# Patient Record
Sex: Female | Born: 1990
Health system: Southern US, Community
[De-identification: ages and names within clinical notes are randomized; demographics above are authoritative.]

---

## 2013-06-24 DIAGNOSIS — F1111 Opioid abuse, in remission: Secondary | ICD-10-CM | POA: Insufficient documentation

## 2015-01-12 ENCOUNTER — Telehealth: Payer: Self-pay | Admitting: *Deleted

## 2015-01-12 NOTE — Telephone Encounter (Signed)
Called patient and left voice mail to call the clinic regarding referral for Hep C. She will need an appt for labs and once this is done she will be scheduled to see Dr. Luciana Axeomer in July. Wendall MolaJacqueline Bjorn Hallas

## 2015-02-07 ENCOUNTER — Other Ambulatory Visit: Payer: Self-pay

## 2015-02-08 ENCOUNTER — Other Ambulatory Visit: Payer: Self-pay

## 2015-02-20 ENCOUNTER — Other Ambulatory Visit: Payer: Medicaid Other

## 2015-02-20 DIAGNOSIS — B182 Chronic viral hepatitis C: Secondary | ICD-10-CM

## 2015-02-20 LAB — COMPREHENSIVE METABOLIC PANEL
ALK PHOS: 47 U/L (ref 39–117)
ALT: 94 U/L — AB (ref 0–35)
AST: 62 U/L — ABNORMAL HIGH (ref 0–37)
Albumin: 4.3 g/dL (ref 3.5–5.2)
BUN: 14 mg/dL (ref 6–23)
CALCIUM: 9.1 mg/dL (ref 8.4–10.5)
CO2: 23 mEq/L (ref 19–32)
Chloride: 104 mEq/L (ref 96–112)
Creat: 0.63 mg/dL (ref 0.50–1.10)
Glucose, Bld: 117 mg/dL — ABNORMAL HIGH (ref 70–99)
POTASSIUM: 4.2 meq/L (ref 3.5–5.3)
SODIUM: 140 meq/L (ref 135–145)
Total Bilirubin: 0.5 mg/dL (ref 0.2–1.2)
Total Protein: 6.7 g/dL (ref 6.0–8.3)

## 2015-02-20 LAB — CBC WITH DIFFERENTIAL/PLATELET
Basophils Absolute: 0 10*3/uL (ref 0.0–0.1)
Basophils Relative: 0 % (ref 0–1)
EOS ABS: 0.1 10*3/uL (ref 0.0–0.7)
EOS PCT: 1 % (ref 0–5)
HEMATOCRIT: 42.1 % (ref 36.0–46.0)
Hemoglobin: 14 g/dL (ref 12.0–15.0)
LYMPHS PCT: 33 % (ref 12–46)
Lymphs Abs: 3.1 10*3/uL (ref 0.7–4.0)
MCH: 29.4 pg (ref 26.0–34.0)
MCHC: 33.3 g/dL (ref 30.0–36.0)
MCV: 88.3 fL (ref 78.0–100.0)
MONOS PCT: 6 % (ref 3–12)
MPV: 9.7 fL (ref 8.6–12.4)
Monocytes Absolute: 0.6 10*3/uL (ref 0.1–1.0)
NEUTROS PCT: 60 % (ref 43–77)
Neutro Abs: 5.7 10*3/uL (ref 1.7–7.7)
Platelets: 306 10*3/uL (ref 150–400)
RBC: 4.77 MIL/uL (ref 3.87–5.11)
RDW: 13.4 % (ref 11.5–15.5)
WBC: 9.5 10*3/uL (ref 4.0–10.5)

## 2015-02-20 LAB — IRON: Iron: 93 ug/dL (ref 42–145)

## 2015-02-21 LAB — ANA: ANA: NEGATIVE

## 2015-02-21 LAB — HEPATITIS B CORE ANTIBODY, TOTAL: HEP B C TOTAL AB: NONREACTIVE

## 2015-02-21 LAB — HEPATITIS B SURFACE ANTIBODY,QUALITATIVE: HEP B S AB: POSITIVE — AB

## 2015-02-21 LAB — HIV ANTIBODY (ROUTINE TESTING W REFLEX): HIV 1&2 Ab, 4th Generation: NONREACTIVE

## 2015-02-21 LAB — PROTIME-INR
INR: 0.95 (ref <1.50)
PROTHROMBIN TIME: 12.7 s (ref 11.6–15.2)

## 2015-02-21 LAB — HEPATITIS B SURFACE ANTIGEN: Hepatitis B Surface Ag: NEGATIVE

## 2015-02-21 LAB — HEPATITIS A ANTIBODY, TOTAL: Hep A Total Ab: REACTIVE — AB

## 2015-02-22 LAB — HEPATITIS C RNA QUANTITATIVE
HCV Quantitative Log: 5.19 {Log} — ABNORMAL HIGH (ref <1.18)
HCV Quantitative: 153133 IU/mL — ABNORMAL HIGH (ref <15)

## 2015-02-24 LAB — HEPATITIS C GENOTYPE

## 2015-03-21 ENCOUNTER — Ambulatory Visit (INDEPENDENT_AMBULATORY_CARE_PROVIDER_SITE_OTHER): Payer: Medicaid Other | Admitting: Internal Medicine

## 2015-03-21 ENCOUNTER — Encounter: Payer: Self-pay | Admitting: Internal Medicine

## 2015-03-21 VITALS — BP 131/86 | HR 76 | Temp 98.1°F | Ht 69.0 in | Wt 165.0 lb

## 2015-03-21 DIAGNOSIS — F1910 Other psychoactive substance abuse, uncomplicated: Secondary | ICD-10-CM | POA: Diagnosis not present

## 2015-03-21 DIAGNOSIS — B182 Chronic viral hepatitis C: Secondary | ICD-10-CM | POA: Insufficient documentation

## 2015-03-21 MED ORDER — OMBITAS-PARITAPRE-RITONA-DASAB 12.5-75-50 &250 MG PO TBPK: 1.0000 | ORAL_TABLET | Freq: Every day | ORAL | Status: DC

## 2015-03-21 MED ORDER — RIBAVIRIN 200 MG PO TABS: ORAL_TABLET | ORAL | Status: DC

## 2015-03-21 NOTE — Progress Notes (Addendum)
HPI:  +Chloe Mitchell is a 24 y.o. female who presents for initial evaluation and management of chronic hepatitis C.  Patient tested positive 1 year ago. 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 Patient denies multiple sexual partners, renal dialysis, sexual contact with person with liver disease, tattoos. Patient has had other studies performed. Results: hepatitis C RNA by PCR, result: positive. Patient has not had prior treatment for Hepatitis C. Patient does not have a past history of liver disease. Patient does not have a family history of liver disease.  She used heroin only a few times and then went into a drug rehab program and successfully completed it.  She has been drug free 1 year.  She was tested 1 year ago and was hepatitis C positive then and now again and is confirmed positive and active chronic hepatitis C.      Patient does have documented immunity to Hepatitis A. Patient does have documented immunity to Hepatitis B.    Review of Systems:  Constitutional: Negative for fatigue, weight loss.  HENT: Negative for hearing loss, ear pain, neck pain, tinnitus and ear discharge.  Eyes: Negative for icterus, discharge and redness.  Respiratory: Negative fordypsnea, wheezing.  Cardiovascular: Negative for chest pain, palpitations, orthopnea, claudication and leg swelling.  Gastrointestinal: Negative for nausea, vomiting, abdominal distention and abdominal pain.Negative for  Genitourinary: Negative for dysuria, urgency, frequency, hematuria and flank pain.  Musculoskeletal: Negative for arthralgias, arthritis Skin: Negative for itching and rash. Neurological: Negative for dizziness and weakness. Endo/Heme/Allergies: Negative for environmental allergies and polydipsia. Does not bruise/bleed easily.      No past medical history on file.  Prior to Admission medications   Medication Sig Start Date End Date Taking?  Authorizing Provider  sertraline (ZOLOFT) 50 MG tablet Take 50 mg by mouth daily.   Yes Historical Provider, MD  Ombitas-Paritapre-Ritona-Dasab (VIEKIRA PAK) 12.5-75-50 &250 MG TBPK Take 1 packet by mouth daily. 03/21/15   Gardiner Barefoot, MD  ribavirin (COPEGUS) 200 MG tablet 600 mg in am and 600 mg in pm 03/21/15   Gardiner Barefoot, MD    No Known Allergies  Social History  Substance Use Topics  . Smoking status: Never Smoker   . Smokeless tobacco: Never Used  . Alcohol Use: No    FH: no history of liver disease, no liver cancer   Objective:   Filed Vitals:   03/21/15 1551  BP: 131/86  Pulse: 76  Temp: 98.1 F (36.7 C)   GEN: in no apparent distress and alert HEENT: anicteric Cardiac: Cor RRR and No murmurs Lungs: clear Abdomen: Bowel sounds are normal, liver is not enlarged, spleen is not enlarged Ext: peripheral pulses normal, no pedal edema, no clubbing or cyanosis Skin: negative for - jaundice, spider hemangioma, telangiectasia, palmar erythema, ecchymosis and atrophy Musculoskeletal: no joint swelling  Laboratory Genotype:  Lab Results  Component Value Date   HCVGENOTYPE 1a 02/20/2015   HCV viral load:  Lab Results  Component Value Date   HCVQUANT 161096* 02/20/2015   Lab Results  Component Value Date   WBC 9.5 02/20/2015   HGB 14.0 02/20/2015   HCT 42.1 02/20/2015   MCV 88.3 02/20/2015   PLT 306 02/20/2015    Lab Results  Component Value Date   CREATININE 0.63 02/20/2015   BUN 14 02/20/2015   NA 140 02/20/2015   K 4.2 02/20/2015   CL 104 02/20/2015   CO2 23  02/20/2015    Lab Results  Component Value Date   ALT 94* 02/20/2015   AST 62* 02/20/2015   ALKPHOS 47 02/20/2015   BILITOT 0.5 02/20/2015   INR 0.95 02/20/2015      Assessment: Chronic Hepatitis C genotype 1a I discussed with the patient the natural history and progression of chronic hepatitis C infection including about 30% of people who develop cirrhosis of the liver and once  cirrhosis is established there is a 2-7% risk per year of liver cancer and liver failure.    Plan: 1) Patient counseled extensively on limiting acetaminophen to no more than 2 grams daily, avoidance of alcohol. 2) Transmission discussed with patient including sexual transmission, sharing razors and toothbrush.   3) Will need referral to gastroenterology if concern for cirrhosis 4) Will need referral for substance abuse counseling: No. 5) Will prescribe Harvoni or Delmar Landau for 12 weeks once work up complete 6) Hepatitis A vaccine No. 7) Hepatitis B vaccine No. 8) Pneumovax vaccine if concern for cirrhosis 9) will follow up after elastography

## 2015-03-21 NOTE — Patient Instructions (Signed)
Date 03/21/2015 Dear Ms Randa Evens, As discussed in the ID Clinic, your hepatitis C therapy will include the following medications:                                       VIEKIRA PAK          Please note that ALL MEDICATIONS WILL START ON THE SAME DATE for a total of 12 weeks. ---------------------------------------------------------------- Your HCV Treatment Start Date: TBA   Your HCV genotype:  1a    Liver Fibrosis: TBD    ---------------------------------------------------------------- YOUR PHARMACY CONTACT:   [facility] [address] Phone: [phone] Hours: Monday to Friday 7:30 am to 6:00 pm   Please always contact your pharmacy at least 3-4 business days before you run out of medications to ensure your next month's medication is ready or 1 week prior to running out if you receive it by mail.  Remember, each prescription is for 28 days. ---------------------------------------------------------------- GENERAL NOTES REGARDING YOUR HEPATITIS C MEDICATION:  VIEKIRA PAK contains 2 different types of tablets. You must take both types of tablets exactly as prescribed, to treat your chronic hepatitis C virus (HCV) infection. . the pink tablet contains: the medicines ombitasvir, paritaprevir, and ritonavir . the beige tablet contains: the medicine dasabuvir  How should I take VIEKIRA PAK? Marland Kitchen When you receive your VIEKIRA PAK prescription, you will get a monthly carton that contains enough medicine for 28 days. . Each monthly carton of VIEKIRA PAK contains 4 smaller cartons.  . Each of the 4 smaller cartons contains enough child resistant daily dose packs of medicine to last for 7 days (1 week). . Each daily dose pack contains all of your VIEKIRA PAK medicine for 1 day (4 tablets). Follow the instructions on each daily dose pack about how to remove the tablets. . Take VIEKIRA PAK tablets with a meal as follows: ?  take the 2 pink tablets (ombitasvir, paritaprevir, and ritonavir), with 1 of the beige tablets (dasabuvir), at about the same time every morning. ? take the second beige tablet (dasabuvir), at about the same time every evening. . If you miss a dose of the pink tablets, and it is less than 12 hours from the time you usually take your dose, take the missed dose with a meal as soon as possible. Then take your next dose at your usual time with a meal. . If you miss a dose of the pink tablets, and it is more than 12 hours from the time you usually take your dose, do not take the missed dose. Take your next dose at your usual time with a meal. . If you miss a dose of the beige tablet, and it is less than 6 hours from the time you usually take your dose, take the missed dose with a meal as soon as possible. Then take your next dose at your usual time with a meal. . If you miss a dose of the beige tablet, and it is more than 6 hours from the time you usually take your dose, do not take the missed dose. Take your next dose at your usual time with a meal. . Do not take more than your prescribed dose of VIEKIRA PAK to make up for a missed dose. . If you take too much VIEKIRA PAK, cal  Ribavirin is an additional medication  required and is taken twice a day. -3 capsules in the morning -2 or 3 capsules in the evening  Common side effects: 1. Nausea 2. Itching 3. Insomnia 4. Fatigue  Do not take with the following drugs: alfuzosin HCL; colchicine; carbamazepine, phenytoin, phenobarbital; gemfibrozil; rifampin; ergotamine, dihydroergotamine, ergonovine, methylergonovine; ethinyl estradiol-containing medicines, such as combined oral contraceptives; St. John's Wort (Hypericum perforatum); lovastatin, simvastatin; pimozide; efavirenz; sildenafil (when dosed as Revatio* for pulmonary arterial hypertension); triazolam and oral midazolam.  Call [phone] ([phone]) for financial assistance in  certain cases. ---------------------------------------------------------------- GENERAL HELPFUL HINTS ON HCV THERAPY: 1. No alcohol. 2. Protect against sun-sensitivity/sunburns (wear sunglasses, hat, long sleeves, pants and sunscreen). 3. Stay well-hydrated/well-moisturized. 4. Notify the ID Clinic of any changes in your other over-the-counter/herbal or prescription medications. 5. If you miss a dose of your medication, take the missed dose as soon as you remember. Return to your regular time/dose schedule the next day.  6.  Do not stop taking your medications without first talking with your healthcare provider. 7.  You may take Tylenol (acetaminophen), as long as the dose is less than 2000 mg (OR no more than 4 tablets of the Tylenol Extra Strengths 500mg  tablet) in 24 hours. 8.  You will need to obtain routine labs and/or office visits at [facility] at weeks 2, 4, 8 and 12 as well as 12 weeks after completion of treatment.   Staci Righter, MD  [facility] [address] [place] [address] [place], [place]  [address] [phone]

## 2015-03-22 ENCOUNTER — Telehealth: Payer: Self-pay | Admitting: *Deleted

## 2015-03-22 NOTE — Telephone Encounter (Signed)
Office note faxed to referring provider, Dr. Lerry Liner at [phone]. Chloe Mitchell

## 2015-03-26 ENCOUNTER — Telehealth: Payer: Self-pay | Admitting: *Deleted

## 2015-03-26 ENCOUNTER — Telehealth: Payer: Self-pay

## 2015-03-26 NOTE — Telephone Encounter (Signed)
Patient's medicaid is working.  [employer] submitted prior authorization request for elastography.  Will schedule once this is approved. Andree Coss, RN

## 2015-03-26 NOTE — Telephone Encounter (Signed)
[employer] requesting copies of office visit and all labs done for patient related to Hep C. Treatment.  Notes and labs printed and faxed to  [employer] [phone].  Copy of document request sent for scanning.   Laurell Josephs, RN

## 2015-04-02 ENCOUNTER — Telehealth: Payer: Self-pay | Admitting: *Deleted

## 2015-04-02 NOTE — Telephone Encounter (Signed)
Called patient and notified her of appt for elastography on 04/11/15 at 6:45 AM. Nothing to eat or drink after midnight. Chloe Mitchell

## 2015-04-03 NOTE — Telephone Encounter (Signed)
Elastography scheduled and patient informed of appt date and time. Chloe Mitchell

## 2015-04-10 ENCOUNTER — Telehealth (HOSPITAL_COMMUNITY): Payer: Self-pay

## 2015-04-10 NOTE — Telephone Encounter (Signed)
Called to remind pt of 7am appt in radiology on 04/11/15, pt agreed to stay npo in prep for exam. AW

## 2015-04-11 ENCOUNTER — Ambulatory Visit (HOSPITAL_COMMUNITY)
Admission: RE | Admit: 2015-04-11 | Discharge: 2015-04-11 | Disposition: A | Discharge status: Home / Self Care | Payer: Medicaid Other | Source: Ambulatory Visit | Attending: Internal Medicine | Admitting: Internal Medicine

## 2015-04-11 DIAGNOSIS — K760 Fatty (change of) liver, not elsewhere classified: Secondary | ICD-10-CM | POA: Diagnosis not present

## 2015-04-11 DIAGNOSIS — R938 Abnormal findings on diagnostic imaging of other specified body structures: Secondary | ICD-10-CM | POA: Diagnosis not present

## 2015-04-11 DIAGNOSIS — B182 Chronic viral hepatitis C: Secondary | ICD-10-CM

## 2015-04-11 DIAGNOSIS — B1920 Unspecified viral hepatitis C without hepatic coma: Secondary | ICD-10-CM | POA: Diagnosis not present

## 2015-04-20 ENCOUNTER — Encounter (HOSPITAL_COMMUNITY): Payer: Self-pay | Admitting: Pharmacy Technician

## 2015-05-16 ENCOUNTER — Ambulatory Visit: Payer: Medicaid Other

## 2015-05-23 ENCOUNTER — Other Ambulatory Visit: Payer: Medicaid Other

## 2015-05-23 ENCOUNTER — Ambulatory Visit: Payer: Medicaid Other | Admitting: Pharmacist Clinician (PhC)/ Clinical Pharmacy Specialist

## 2015-05-23 DIAGNOSIS — B182 Chronic viral hepatitis C: Secondary | ICD-10-CM

## 2015-05-23 LAB — CBC
HCT: 37.9 % (ref 36.0–46.0)
Hemoglobin: 12.7 g/dL (ref 12.0–15.0)
MCH: 29 pg (ref 26.0–34.0)
MCHC: 33.5 g/dL (ref 30.0–36.0)
MCV: 86.5 fL (ref 78.0–100.0)
MPV: 9.5 fL (ref 8.6–12.4)
PLATELETS: 293 10*3/uL (ref 150–400)
RBC: 4.38 MIL/uL (ref 3.87–5.11)
RDW: 13.3 % (ref 11.5–15.5)
WBC: 9.8 10*3/uL (ref 4.0–10.5)

## 2015-05-23 NOTE — Progress Notes (Signed)
Patient ID: Chloe Mitchell, female   DOB: 04/13/1991, 24 y.o.   MRN: [id number] HPI: Chloe Mitchell is a 24 y.o. female who is here for her 3 wks hep C visit.   Lab Results  Component Value Date   HCVGENOTYPE 1a 02/20/2015    Allergies: No Known Allergies  Vitals:    Past Medical History: No past medical history on file.  Social History: Social History   Social History  . Marital Status: Single    Spouse Name: N/A  . Number of Children: N/A  . Years of Education: N/A   Social History Main Topics  . Smoking status: Never Smoker   . Smokeless tobacco: Never Used  . Alcohol Use: No  . Drug Use: No     Comment: per patient clean for 2 years 2014  . Sexual Activity: No   Other Topics Concern  . Not on file   Social History Narrative  . No narrative on file    Labs: HEP B S AB (no units)  Date Value  02/20/2015 POS*   HEPATITIS B SURFACE AG (no units)  Date Value  02/20/2015 NEGATIVE    Lab Results  Component Value Date   HCVGENOTYPE 1a 02/20/2015    Hepatitis C RNA quantitative Latest Ref Rng 02/20/2015  HCV Quantitative <15 IU/mL 153133(H)  HCV Quantitative Log <1.18 log 10 5.19(H)    AST (U/L)  Date Value  02/20/2015 62*   ALT (U/L)  Date Value  02/20/2015 94*   INR (no units)  Date Value  02/20/2015 0.95    CrCl: CrCl cannot be calculated (Unknown ideal weight.).  Fibrosis Score: F2/3 as assessed by [facility]  Child-Pugh Score: Class A  Previous Treatment Regimen: Naive  Assessment: 24 yo who is here for her 3 weeks visit. She started viekira/riba around 9/25. She has done well on it. She did tell me that she did missed a dose of Viekira and not the ribavirin. She then double up the dose the next day. I had to adamantly tell to never do that again and to set a phone reminder for her daily meds. We are going to get a CBC today to eval for anemia. Otherwise, she has tolerated very well. She is going to come back in a week or so  for the VL to get the extension. I also set her an appt with Dr. Luciana Axeomer in December.   Recommendations:  Cont Viekira and ribavirin CBC today VL in 2 week  Ulyses [place]ward[place], Almando Brawley WestmorelandQuang, VermontPharm.D., BCPS, AAHIVP Clinical Infectious Disease Pharmacist [facility] 05/23/2015, 4:12 PM

## 2015-05-24 ENCOUNTER — Other Ambulatory Visit: Payer: Medicaid Other

## 2015-06-06 ENCOUNTER — Telehealth: Payer: Self-pay | Admitting: Pharmacy Technician

## 2015-06-06 ENCOUNTER — Other Ambulatory Visit: Payer: Medicaid Other

## 2015-06-13 ENCOUNTER — Other Ambulatory Visit: Payer: Medicaid Other

## 2015-06-13 DIAGNOSIS — B182 Chronic viral hepatitis C: Secondary | ICD-10-CM

## 2015-06-13 LAB — CBC
HCT: 38.8 % (ref 36.0–46.0)
Hemoglobin: 12.2 g/dL (ref 12.0–15.0)
MCH: 28 pg (ref 26.0–34.0)
MCHC: 31.4 g/dL (ref 30.0–36.0)
MCV: 89.2 fL (ref 78.0–100.0)
MPV: 9.4 fL (ref 8.6–12.4)
Platelets: 346 10*3/uL (ref 150–400)
RBC: 4.35 MIL/uL (ref 3.87–5.11)
RDW: 13.7 % (ref 11.5–15.5)
WBC: 11.5 10*3/uL — AB (ref 4.0–10.5)

## 2015-06-18 ENCOUNTER — Other Ambulatory Visit: Payer: Medicaid Other

## 2015-06-18 ENCOUNTER — Other Ambulatory Visit: Payer: Self-pay | Admitting: Internal Medicine

## 2015-06-18 DIAGNOSIS — B182 Chronic viral hepatitis C: Secondary | ICD-10-CM

## 2015-06-20 LAB — HEPATITIS C RNA QUANTITATIVE: HCV Quantitative: NOT DETECTED IU/mL (ref <15)

## 2015-07-16 ENCOUNTER — Other Ambulatory Visit: Payer: Self-pay | Admitting: Pharmacist Clinician (PhC)/ Clinical Pharmacy Specialist

## 2015-07-16 MED ORDER — RIBAVIRIN 200 MG PO TABS: ORAL_TABLET | ORAL | Status: DC

## 2015-07-26 ENCOUNTER — Ambulatory Visit (INDEPENDENT_AMBULATORY_CARE_PROVIDER_SITE_OTHER): Payer: Medicaid Other | Admitting: Internal Medicine

## 2015-07-26 ENCOUNTER — Encounter: Payer: Self-pay | Admitting: Internal Medicine

## 2015-07-26 VITALS — BP 154/82 | HR 85 | Temp 97.7°F | Wt 167.0 lb

## 2015-07-26 DIAGNOSIS — K7400 Hepatic fibrosis, unspecified: Secondary | ICD-10-CM | POA: Insufficient documentation

## 2015-07-26 DIAGNOSIS — B182 Chronic viral hepatitis C: Secondary | ICD-10-CM | POA: Diagnosis not present

## 2015-07-26 DIAGNOSIS — F1910 Other psychoactive substance abuse, uncomplicated: Secondary | ICD-10-CM

## 2015-07-26 NOTE — Progress Notes (Signed)
   Subjective:    Patient ID: Chloe DartingAnna Victoria Mitchell, female    DOB: 12/18/1990, 24 y.o.   MRN: [id number]  HPI Here for follow up of HCV.   Started [employer]ira Pak and now almost finished with about 1 week left.  Viral load in November undetectable.  No issues with the medicine except some itching.  Hgb remained stable.  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  No further alcohol, no drugs.  No fatigue.  Has remained off of drugs.  Does not drink alcohol now.     Review of Systems  Constitutional: Negative for fatigue.  Skin: Negative for rash.  Neurological: Negative for dizziness and light-headedness.       Objective:   Physical Exam  Constitutional: She appears well-developed and well-nourished. No distress.  HENT:  Mouth/Throat: No oropharyngeal exudate.  Eyes: No scleral icterus.  Cardiovascular: Normal rate, regular rhythm and normal heart sounds.   No murmur heard. Pulmonary/Chest: Effort normal and breath sounds normal. No respiratory distress.  Lymphadenopathy:    She has no cervical adenopathy.  Skin: No rash noted.    Social History   Social History  . Marital Status: Single    Spouse Name: N/A  . Number of Children: N/A  . Years of Education: N/A   Occupational History  . Not on file.   Social History Main Topics  . Smoking status: Never Smoker   . Smokeless tobacco: Never Used  . Alcohol Use: No  . Drug Use: No     Comment: per patient clean for 2 years 2014  . Sexual Activity: No   Other Topics Concern  . Not on file   Social History Narrative        Assessment & Plan:

## 2015-07-26 NOTE — Assessment & Plan Note (Signed)
Early vl undetectable.  Will recheck in 1 month and see her in 4 months for SVR12.

## 2015-07-26 NOTE — Assessment & Plan Note (Signed)
Encouraged continued abstinence and she appears very motivated.

## 2015-07-26 NOTE — Assessment & Plan Note (Signed)
Discussed results with her.  Will recheck in about 1 year.

## 2015-08-27 ENCOUNTER — Other Ambulatory Visit: Payer: Medicaid Other

## 2015-08-27 DIAGNOSIS — B182 Chronic viral hepatitis C: Secondary | ICD-10-CM

## 2015-08-27 LAB — CBC WITH DIFFERENTIAL/PLATELET
BASOS PCT: 0 % (ref 0–1)
Basophils Absolute: 0 10*3/uL (ref 0.0–0.1)
EOS ABS: 0.1 10*3/uL (ref 0.0–0.7)
Eosinophils Relative: 1 % (ref 0–5)
HCT: 42.3 % (ref 36.0–46.0)
HEMOGLOBIN: 13.5 g/dL (ref 12.0–15.0)
Lymphocytes Relative: 36 % (ref 12–46)
Lymphs Abs: 3.9 10*3/uL (ref 0.7–4.0)
MCH: 28.2 pg (ref 26.0–34.0)
MCHC: 31.9 g/dL (ref 30.0–36.0)
MCV: 88.5 fL (ref 78.0–100.0)
MONO ABS: 0.5 10*3/uL (ref 0.1–1.0)
MPV: 9.9 fL (ref 8.6–12.4)
Monocytes Relative: 5 % (ref 3–12)
NEUTROS ABS: 6.3 10*3/uL (ref 1.7–7.7)
Neutrophils Relative %: 58 % (ref 43–77)
PLATELETS: 296 10*3/uL (ref 150–400)
RBC: 4.78 MIL/uL (ref 3.87–5.11)
RDW: 13.2 % (ref 11.5–15.5)
WBC: 10.9 10*3/uL — AB (ref 4.0–10.5)

## 2015-08-28 LAB — HEPATITIS C RNA QUANTITATIVE: HCV Quantitative: NOT DETECTED IU/mL (ref <15)

## 2015-09-28 NOTE — Telephone Encounter (Signed)
Left VM for her to call me back.  She needs to come in for HCV quantitative labs to get Medicaid extension approved for final month of medication

## 2015-10-15 IMAGING — US US ABDOMEN COMPLETE W/ ELASTOGRAPHY
1 series · 12 of 16 positions shown · non-contrast
Comparison: None.

CLINICAL DATA: 24-year-old female with hepatitis-C.



[Series 1: us abdomen complete w/ elastography · 0.17mm/px · 12 of 16 slices shown]
[im 1/16]
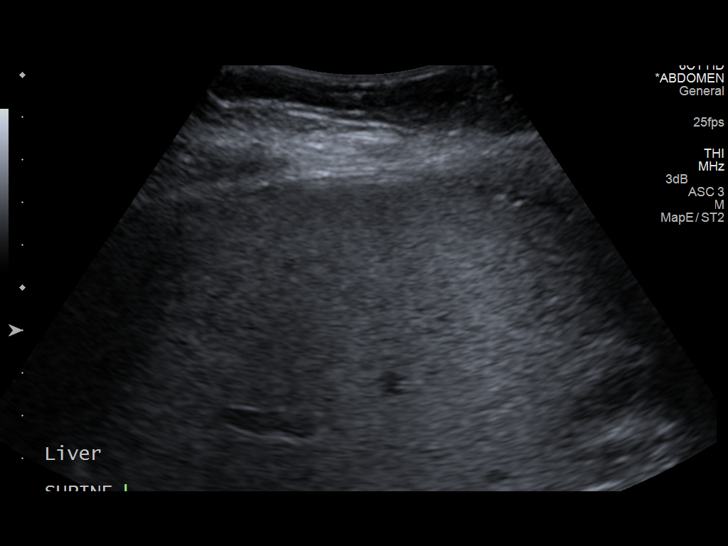
[im 3/16]
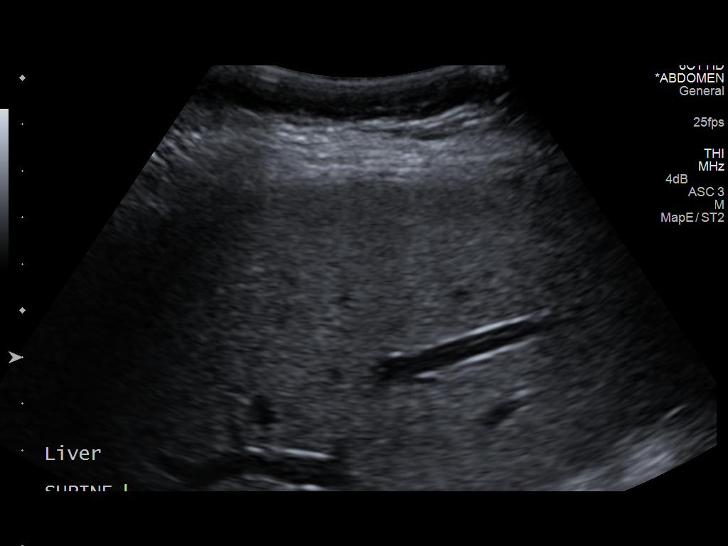
[im 4/16]
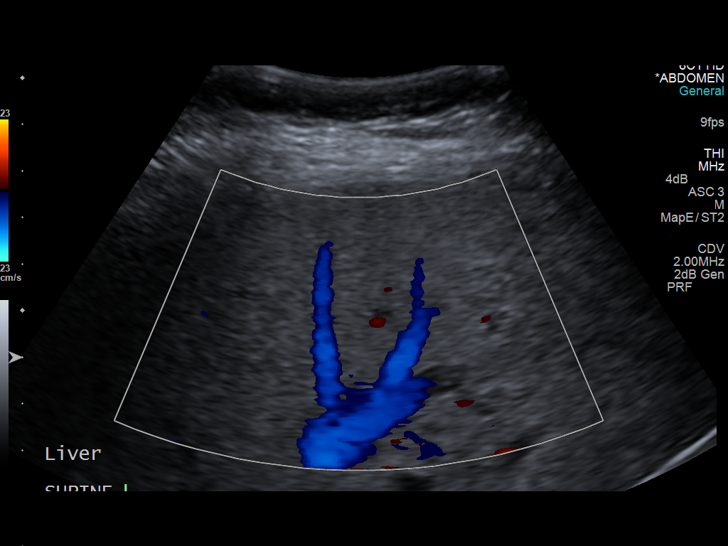
[im 5/16]
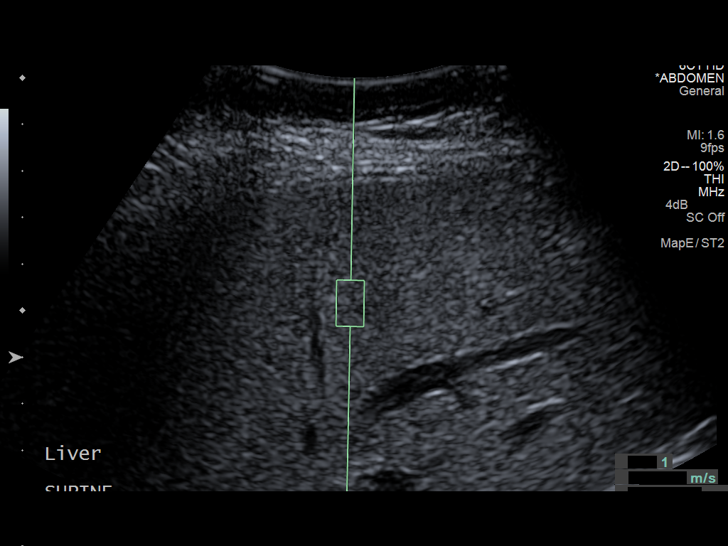
[im 7/16]
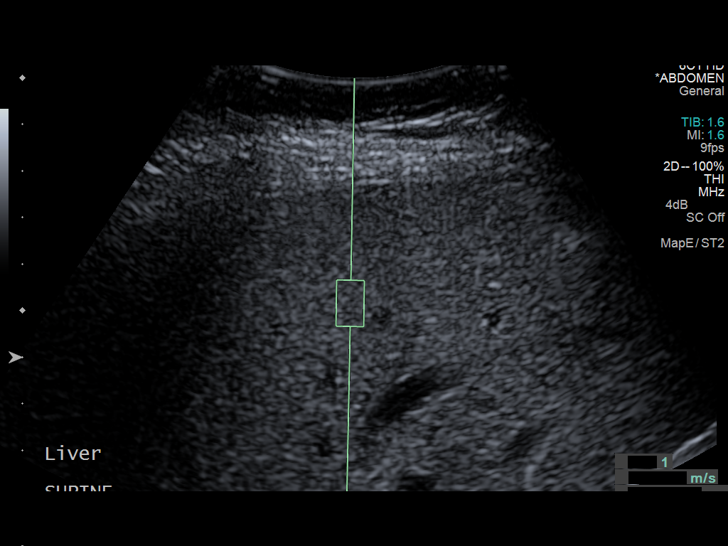
[im 8/16]
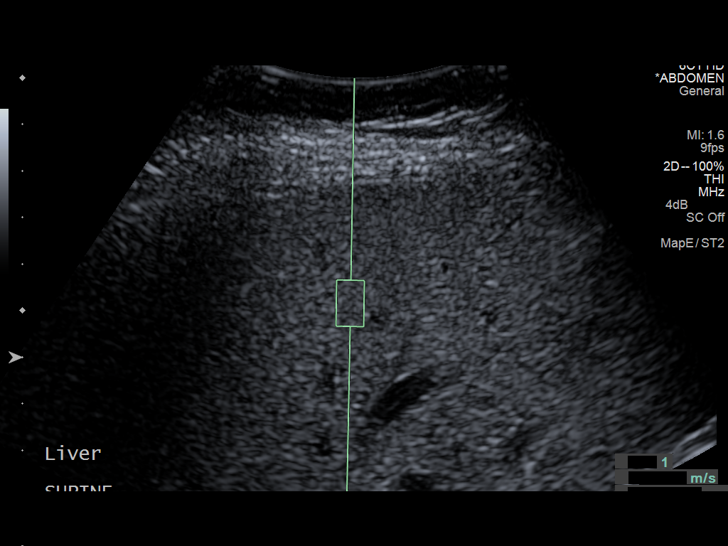
[im 9/16]
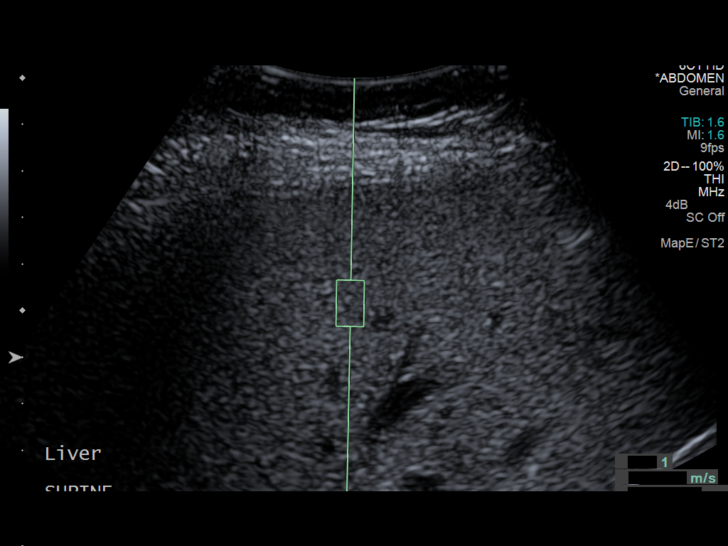
[im 11/16]
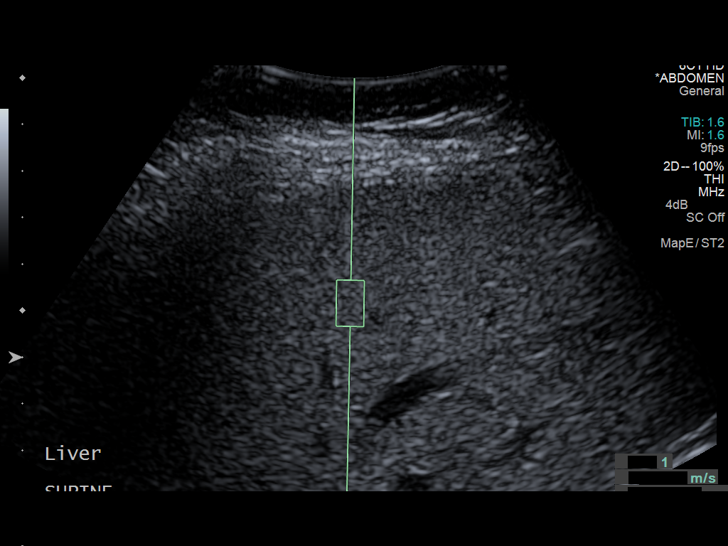
[im 12/16]
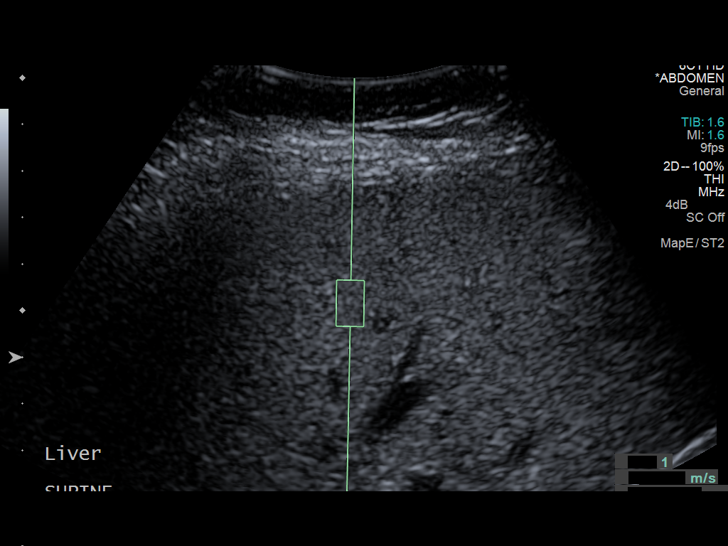
[im 13/16]
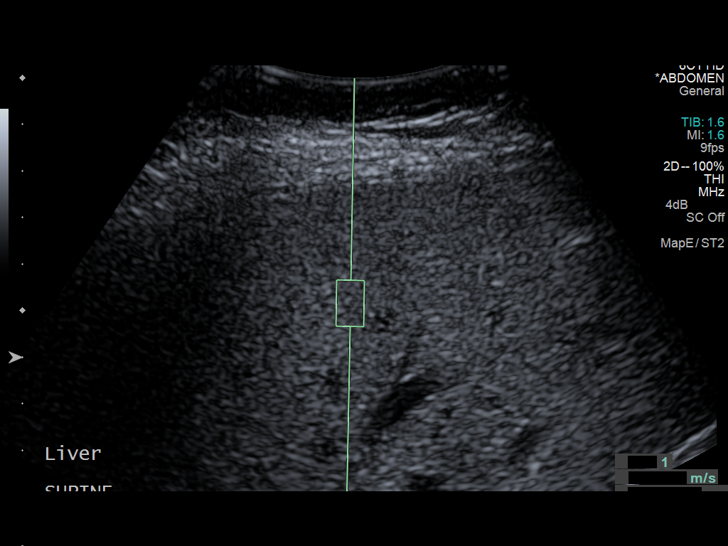
[im 15/16]
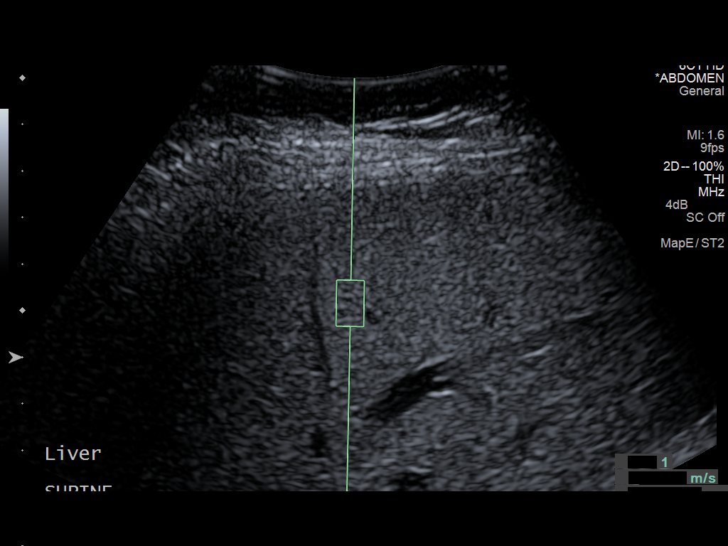
[im 16/16]
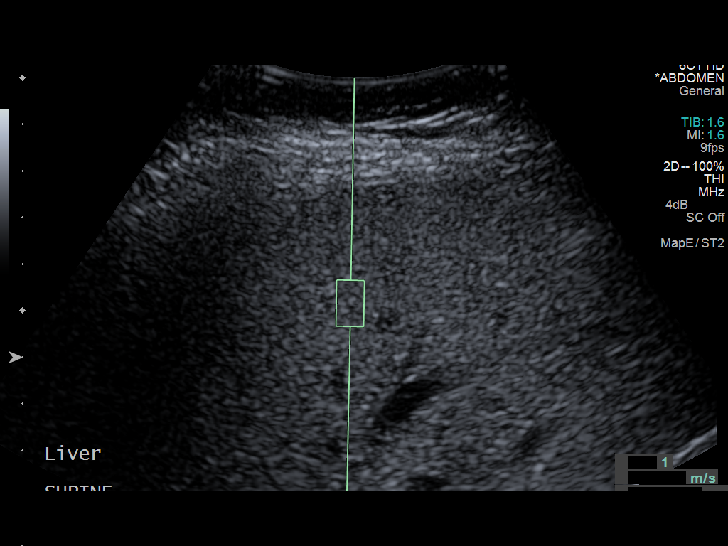

[12 of 16 positions shown; findings below may reference images not displayed]

FINDINGS: ULTRASOUND ABDOMEN

Gallbladder: No gallstones, gallbladder wall thickening or
pericholecystic fluid. No sonographic Murphy sign noted. There is a
solitary nonmobile non shadowing 0.5 cm structure in the dependent
gallbladder wall, most in keeping with a solitary gallbladder polyp,
although the differential includes an adherent sludge ball.

Common bile duct: Diameter: 3 mm

Liver: Liver parenchyma is diffusely mildly to moderately echogenic
with posterior acoustic attenuation, in keeping with
mild-to-moderate diffuse hepatic steatosis. No liver mass is
detected, noting decreased sensitivity in the setting of an
echogenic liver. The liver surface appears smooth. The main portal
vein is patent with appropriate flow direction.

IVC: No abnormality visualized.

Pancreas: Visualized portion unremarkable.

Spleen: Size and appearance within normal limits.

Right Kidney: Length: 11.7 cm. Echogenicity within normal limits. No
mass or hydronephrosis visualized.

Left Kidney: Length: 12.4 cm. Echogenicity within normal limits. No
mass or hydronephrosis visualized.

Abdominal aorta: No aneurysm visualized.

Other findings: None.

ULTRASOUND HEPATIC ELASTOGRAPHY

Device: Siemens Helix VTQ

Transducer 6C1

Patient position: Supine

Number of measurements:  10

Hepatic Segment:  8

Median velocity:   1.62  m/sec

IQR:

IQR/Median velocity ratio

Corresponding Metavir fibrosis score:  F2 + some F3

Risk of fibrosis: Moderate

Limitations of exam: None

Pertinent findings noted on other imaging exams:  None

Please note that abnormal shear wave velocities may also be
identified in clinical settings other than with hepatic fibrosis,
such as: acute hepatitis, elevated right heart and central venous
pressures including use of beta blockers, Channing disease
([place]), infiltrative processes such as
mastocytosis/amyloidosis/infiltrative tumor, extrahepatic
cholestasis, in the post-prandial state, and liver transplantation.
Correlation with patient history, laboratory data, and clinical
condition recommended.
IMPRESSION: 1. Mild-to-moderate diffuse hepatic steatosis. Otherwise normal
liver.
2. Solitary 5 mm gallbladder polyp versus adherent sludge ball, for
which no further imaging follow-up is required. No cholelithiasis.
No biliary ductal dilatation.
3. Hepatic elastography results:

Median hepatic shear wave velocity is calculated at 1.62 m/sec.

Corresponding Metavir fibrosis score is F2 + some F3.

Risk of fibrosis is moderate.

Follow-up:  Additional testing appropriate.

## 2015-11-27 ENCOUNTER — Ambulatory Visit (INDEPENDENT_AMBULATORY_CARE_PROVIDER_SITE_OTHER): Payer: Medicaid Other | Admitting: Internal Medicine

## 2015-11-27 VITALS — BP 123/81 | HR 84 | Temp 98.1°F | Ht 68.0 in | Wt 170.5 lb

## 2015-11-27 DIAGNOSIS — B182 Chronic viral hepatitis C: Secondary | ICD-10-CM | POA: Diagnosis not present

## 2015-11-27 DIAGNOSIS — K7400 Hepatic fibrosis, unspecified: Secondary | ICD-10-CM

## 2015-11-27 NOTE — Progress Notes (Signed)
   Subjective:    Patient ID: Chloe Mitchell, female    DOB: 03/25/1991, 25 y.o.   MRN: [id number]  HPI Here for follow up of HCV.   Started [employer]Viekira Pak and now has finished [facility]  Viral load in November and January undetectable.  No issues with the medicine except some itching.  Hgb remained stable.  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  No further alcohol, no drugs.  No fatigue.  Has remained off of drugs.  Does not drink alcohol now.  Feels well.     Review of Systems  Constitutional: Negative for fatigue.  Skin: Negative for rash.  Neurological: Negative for dizziness and light-headedness.       Objective:   Physical Exam  Constitutional: She appears well-developed and well-nourished. No distress.  HENT:  Mouth/Throat: No oropharyngeal exudate.  Eyes: No scleral icterus.  Cardiovascular: Normal rate, regular rhythm and normal heart sounds.   No murmur heard. Pulmonary/Chest: Effort normal and breath sounds normal. No respiratory distress.  Lymphadenopathy:    She has no cervical adenopathy.  Skin: No rash noted.    Social History   Social History  . Marital Status: Single    Spouse Name: N/A  . Number of Children: N/A  . Years of Education: N/A   Occupational History  . Not on file.   Social History Main Topics  . Smoking status: Never Smoker   . Smokeless tobacco: Never Used  . Alcohol Use: No  . Drug Use: No     Comment: per patient clean for 2 years 2014  . Sexual Activity: No   Other Topics Concern  . Not on file   Social History Narrative        Assessment & Plan:

## 2015-11-27 NOTE — Assessment & Plan Note (Signed)
Last test today to confirm cure.  Will notify her of results.

## 2015-11-27 NOTE — Assessment & Plan Note (Signed)
I will recheck elastography in 6 months to compare.

## 2015-11-28 LAB — HEPATITIS C RNA QUANTITATIVE: HCV QUANT: NOT DETECTED [IU]/mL (ref <15)

## 2015-11-29 ENCOUNTER — Telehealth: Payer: Self-pay | Admitting: *Deleted

## 2015-11-29 NOTE — Telephone Encounter (Signed)
-----   Message from Gardiner Barefootobert W Comer, MD sent at 11/29/2015  8:52 AM EDT ----- Please let her know her final HCV viral load is undetectable and considered cured now.  She sees me in 6 months to recheck her liver.

## 2015-11-29 NOTE — Telephone Encounter (Signed)
Called and left patient a voice mail to return my call regarding her lab results. She will need to call back to schedule an appt in October 2017. Wendall MolaJacqueline Cockerham

## 2015-11-29 NOTE — Telephone Encounter (Signed)
Patient returned call and informed her of message from Dr Luciana Axeomer. She advised she will call back later to schedule her follow up appt.

## 2015-11-29 NOTE — Telephone Encounter (Signed)
See Dr Comer's message to the pt.  Please share with the pt.  She will need to call for a return appt.

## 2015-11-29 NOTE — Telephone Encounter (Signed)
-----   Message from Robert W Comer, MD sent at 11/29/2015  8:52 AM EDT ----- Please let her know her final HCV viral load is undetectable and considered cured now.  She sees me in 6 months to recheck her liver.   

## 2016-08-17 DIAGNOSIS — R05 Cough: Secondary | ICD-10-CM | POA: Diagnosis not present

## 2016-08-17 DIAGNOSIS — J0110 Acute frontal sinusitis, unspecified: Secondary | ICD-10-CM | POA: Diagnosis not present

## 2016-11-28 DIAGNOSIS — Z3042 Encounter for surveillance of injectable contraceptive: Secondary | ICD-10-CM | POA: Diagnosis not present

## 2016-11-28 DIAGNOSIS — Z01419 Encounter for gynecological examination (general) (routine) without abnormal findings: Secondary | ICD-10-CM | POA: Diagnosis not present

## 2017-03-04 DIAGNOSIS — Z3042 Encounter for surveillance of injectable contraceptive: Secondary | ICD-10-CM | POA: Diagnosis not present

## 2017-03-04 DIAGNOSIS — Z3200 Encounter for pregnancy test, result unknown: Secondary | ICD-10-CM | POA: Diagnosis not present

## 2017-05-29 DIAGNOSIS — Z0000 Encounter for general adult medical examination without abnormal findings: Secondary | ICD-10-CM | POA: Diagnosis not present

## 2017-05-29 DIAGNOSIS — Z13220 Encounter for screening for lipoid disorders: Secondary | ICD-10-CM | POA: Diagnosis not present

## 2017-05-29 DIAGNOSIS — Z131 Encounter for screening for diabetes mellitus: Secondary | ICD-10-CM | POA: Diagnosis not present

## 2017-06-10 DIAGNOSIS — J019 Acute sinusitis, unspecified: Secondary | ICD-10-CM | POA: Diagnosis not present

## 2017-06-26 DIAGNOSIS — Z309 Encounter for contraceptive management, unspecified: Secondary | ICD-10-CM | POA: Diagnosis not present

## 2017-09-18 DIAGNOSIS — Z3041 Encounter for surveillance of contraceptive pills: Secondary | ICD-10-CM | POA: Diagnosis not present

## 2017-09-18 DIAGNOSIS — L989 Disorder of the skin and subcutaneous tissue, unspecified: Secondary | ICD-10-CM | POA: Diagnosis not present

## 2017-12-25 DIAGNOSIS — Z3202 Encounter for pregnancy test, result negative: Secondary | ICD-10-CM | POA: Diagnosis not present

## 2017-12-25 DIAGNOSIS — Z3042 Encounter for surveillance of injectable contraceptive: Secondary | ICD-10-CM | POA: Diagnosis not present

## 2018-06-19 DIAGNOSIS — J019 Acute sinusitis, unspecified: Secondary | ICD-10-CM | POA: Diagnosis not present

## 2018-11-08 DIAGNOSIS — F4310 Post-traumatic stress disorder, unspecified: Secondary | ICD-10-CM | POA: Insufficient documentation

## 2020-02-16 ENCOUNTER — Telehealth (INDEPENDENT_AMBULATORY_CARE_PROVIDER_SITE_OTHER): Payer: No Payment, Other | Admitting: Psychiatric/Mental Health

## 2020-02-16 ENCOUNTER — Other Ambulatory Visit: Payer: Self-pay

## 2020-02-16 DIAGNOSIS — F3181 Bipolar II disorder: Secondary | ICD-10-CM | POA: Insufficient documentation

## 2020-02-16 DIAGNOSIS — F4310 Post-traumatic stress disorder, unspecified: Secondary | ICD-10-CM

## 2020-02-16 MED ORDER — ARIPIPRAZOLE 10 MG PO TABS: 10.0000 mg | ORAL_TABLET | Freq: Every day | ORAL | 2 refills | Status: DC

## 2020-02-16 MED ORDER — FLUOXETINE HCL 20 MG PO CAPS: 20.0000 mg | ORAL_CAPSULE | Freq: Every day | ORAL | 2 refills | Status: DC

## 2020-02-16 NOTE — Progress Notes (Signed)
Psychiatric Initial Adult Assessment  Virtual Visit via Video Note  I connected with  Chloe Mitchell  on 02/16/20  by a video enabled telemedicine application and verified that I am speaking with the correct person using two identifiers.  Location: Patient: Home Provider: Home office  I discussed the limitations of evaluation and management by telemedicine and the availability of in person appointments. The patient expressed understanding and agreed to proceed.  I provided 45 minutes of non-face-to-face time during this encounter.  Patient Identification: Chloe Mitchell MRN:  [id number] Date of Evaluation:  02/16/2020 Referral Source: Vesta Mixer Chief Complaint:  "Fairly well, good" Visit Diagnosis:    ICD-10-CM   1. Bipolar II disorder (HCC)  F31.81 ARIPiprazole (ABILIFY) 10 MG tablet    FLUoxetine (PROZAC) 20 MG capsule  2. Posttraumatic stress disorder  F43.10     History of Present Illness:  Chloe Mitchell is a 29 year-old female seen today for initial psych evaluation.  Patient was referred to outpatient psychiatry by [facility] for medication management.  History of bipolar 2 disorder and posttraumatic stress disorder.  Chloe Mitchell reports her mood as fairly well and going good, she reports her sleep is fine, and appetite is good.  [name] states that she feels medication is adequate in managing her symptoms but her bipolar do disorder and PTSD.  She is currently prescribed Abilify 10 mg taken nightl and Prozac 20 mg daily.  She states that her depressed moods have been better but she feels as though she can use an increase in Prozac to further increase her motivation and concentration.  Writer has agreed to increase Prozac from 20 mg to 40 mg daily as patient has been on 20 mg in excess of 8 weeks.    Patient states that her mom died in 2023/01/03 and that her dad died in 12/04/2022 as a result of complications from Covid.  Although she states that these 2 deaths have been devastating she reports doing well.  She is a former  substance abuser and she states that she has abstain from drugs in over 2 years.  Patient denies SI, HI and AVH.  Patient has agreed to continue medication regimen as prescribed with the exception of an increase of Prozac from 20 mg to 40 mg, other than that no further concerns.  Patient has agreed to follow-up in 8 weeks.  Associated Signs/Symptoms: Depression Symptoms:  difficulty concentrating, impaired memory, (Hypo) Manic Symptoms:  Labiality of Mood, Anxiety Symptoms:  Social Anxiety, Psychotic Symptoms:  na PTSD Symptoms: NA  Past Psychiatric History: Bipolar 2   Previous Psychotropic Medications: Yes   Substance Abuse History in the last 12 months:  No.  Consequences of Substance Abuse: NA  Past Medical History: No past medical history on file.   Family Psychiatric History: unknown  Family History: No family history on file.  Social History:   Social History   Socioeconomic History  . Marital status: Single    Spouse name: Not on file  . Number of children: Not on file  . Years of education: Not on file  . Highest education level: Not on file  Occupational History  . Not on file  Tobacco Use  . Smoking status: Never Smoker  . Smokeless tobacco: Never Used  Substance and Sexual Activity  . Alcohol use: No    Alcohol/week: 0.0 standard drinks  . Drug use: No    Comment: per patient clean for 2 years 12/03/12  . Sexual activity: Never    Partners: Male  Other Topics Concern  . Not on file  Social History Narrative  . Not on file   Social Determinants of Health   Financial Resource Strain:   . Difficulty of Paying Living Expenses:   Food Insecurity:   . Worried About Programme researcher, broadcasting/film/video in the Last Year:   . Barista in the Last Year:   Transportation Needs:   . Freight forwarder (Medical):   Marland Kitchen Lack of Transportation (Non-Medical):   Physical Activity:   . Days of Exercise per Week:   . Minutes of Exercise per Session:   Stress:   .  Feeling of Stress :   Social Connections:   . Frequency of Communication with Friends and Family:   . Frequency of Social Gatherings with Friends and Family:   . Attends Religious Services:   . Active Member of Clubs or Organizations:   . Attends Banker Meetings:   Marland Kitchen Marital Status:     Additional Social History: unknown  Allergies:  No Known Allergies  Metabolic Disorder Labs: No results found for: HGBA1C, MPG No results found for: PROLACTIN No results found for: CHOL, TRIG, HDL, CHOLHDL, VLDL, LDLCALC No results found for: TSH  Therapeutic Level Labs: No results found for: LITHIUM No results found for: CBMZ No results found for: VALPROATE  Current Medications: Current Outpatient Medications  Medication Sig Dispense Refill  . ARIPiprazole (ABILIFY) 10 MG tablet Take 1 tablet (10 mg total) by mouth daily. 30 tablet 2  . FLUoxetine (PROZAC) 20 MG capsule Take 1 capsule (20 mg total) by mouth daily. 30 capsule 2   No current facility-administered medications for this visit.    Musculoskeletal: Strength & Muscle Tone: within normal limits Gait & Station: normal Patient leans: N/A  Psychiatric Specialty Exam: Review of Systems  Psychiatric/Behavioral: Negative for agitation, dysphoric mood, sleep disturbance and suicidal ideas.  All other systems reviewed and are negative.   There were no vitals taken for this visit.There is no height or weight on file to calculate BMI.  General Appearance: NA  Eye Contact:  Good  Speech:  Clear and Coherent  Volume:  Normal  Mood:  Euthymic  Affect:  Congruent  Thought Process:  Coherent and Descriptions of Associations: Intact  Orientation:  Full (Time, Place, and Person)  Thought Content:  WDL and Logical  Suicidal Thoughts:  No  Homicidal Thoughts:  No  Memory:  Immediate;   Fair  Judgement:  Fair  Insight:  Fair  Psychomotor Activity:  Normal  Concentration:  Concentration: Fair  Recall:  Good  Fund of  Knowledge:Good  Language: Good  Akathisia:  NA  Handed:  Right  AIMS (if indicated):  not done  Assets:  Communication Skills Desire for Improvement Financial Resources/Insurance Physical Health Resilience  ADL's:  Intact  Cognition: WNL  Sleep:  Fair   Screenings: PHQ2-9     Office Visit from 11/27/2015 in [facility] for Infectious Disease Office Visit from 07/26/2015 in [facility] for Infectious Disease Office Visit from 03/21/2015 in [facility] for Infectious Disease  PHQ-2 Total Score 0 0 0      Assessment and Plan: Patient has agreed to continue with medication regimen as prescribed with the exception of an increase of Prozac from 20 mg to 40 mg daily.  Patient reports medication is effective in managing bipolar 2 symptoms.  Patient has agreed to follow-up in 8 weeks.  1. Bipolar II disorder (HCC)  F31.81 ARIPiprazole (ABILIFY) 10 MG tablet    FLUoxetine (PROZAC) 20 MG capsule  2. Posttraumatic stress disorder  F43.10       Jearld Lesch, NP 7/8/20211:59 PM

## 2020-02-20 ENCOUNTER — Encounter (HOSPITAL_COMMUNITY): Payer: Self-pay | Admitting: Psychiatric/Mental Health

## 2020-03-01 MED ORDER — FLUOXETINE HCL 20 MG PO CAPS: 40.0000 mg | ORAL_CAPSULE | Freq: Every day | ORAL | 3 refills | Status: DC

## 2020-03-01 NOTE — Addendum Note (Signed)
Addended by: Lerry Liner on: 03/01/2020 02:41 PM   Modules accepted: Orders

## 2020-04-19 ENCOUNTER — Telehealth (HOSPITAL_COMMUNITY): Payer: Self-pay

## 2020-05-03 ENCOUNTER — Other Ambulatory Visit: Payer: Self-pay

## 2020-05-03 ENCOUNTER — Encounter (HOSPITAL_COMMUNITY): Payer: Self-pay | Admitting: Psychiatry

## 2020-05-03 ENCOUNTER — Telehealth (INDEPENDENT_AMBULATORY_CARE_PROVIDER_SITE_OTHER): Payer: No Payment, Other | Admitting: Psychiatry

## 2020-05-03 DIAGNOSIS — F900 Attention-deficit hyperactivity disorder, predominantly inattentive type: Secondary | ICD-10-CM | POA: Insufficient documentation

## 2020-05-03 DIAGNOSIS — F3181 Bipolar II disorder: Secondary | ICD-10-CM

## 2020-05-03 MED ORDER — ATOMOXETINE HCL 40 MG PO CAPS: 40.0000 mg | ORAL_CAPSULE | Freq: Every day | ORAL | 2 refills | Status: DC

## 2020-05-03 MED ORDER — FLUOXETINE HCL 20 MG PO CAPS: 40.0000 mg | ORAL_CAPSULE | Freq: Every day | ORAL | 2 refills | Status: DC

## 2020-05-03 MED ORDER — ARIPIPRAZOLE 10 MG PO TABS: 10.0000 mg | ORAL_TABLET | Freq: Every day | ORAL | 2 refills | Status: DC

## 2020-05-03 NOTE — Progress Notes (Signed)
BH MD/PA/NP OP Progress Note Virtual Visit via Video Note  I connected with Chloe Mitchell on 05/03/20 at  5:00 PM EDT by a video enabled telemedicine application and verified that I am speaking with the correct person using two identifiers.  Location: Patient: Home Provider: Clinic   I discussed the limitations of evaluation and management by telemedicine and the availability of in person appointments. The patient expressed understanding and agreed to proceed.  I provided 30 minutes of non-face-to-face time during this encounter.     05/03/2020 1:35 PM Chloe Mitchell  MRN:  [id number]  Chief Complaint: "I need concentrating" HPI:  Visit Diagnosis:    ICD-10-CM   1. Attention deficit hyperactivity disorder (ADHD), predominantly inattentive type  F90.0 atomoxetine (STRATTERA) 40 MG capsule  2. Bipolar II disorder (HCC)  F31.81 ARIPiprazole (ABILIFY) 10 MG tablet    FLUoxetine (PROZAC) 20 MG capsule    Past Psychiatric History: 29 year-old female seen today for follow up psychiatric evaluation. 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 She is currently being managed on Abilify 10mg  daily and Prozac 40mg  daily.  Today patient is pleasant, calm, cooperative, maintained eye contact. and engaged in conversation. She informed provider that earlier this year both parents passed away due to COVID which was a major stressor. She states she is currently doing grief counseling through Hospice. Patient states this week was particularly difficult for her due to the feelings brought up in therapy. She denies ongoing symptoms of anxiety and depression and endorses adequate appetiete and sleep. She note that since increasing Prozac her energy levels hasimproved. She denies SI/HI/AVH or paranoia.  Patient informed provider that she has difficulty concentrating. She notes that her concentration has gotten worse  since her parents became sick in May. She endorses difficulty focusing in school, forgetfulness, distractibility, and avoidance of mentally taxing activities. Patient states she is concerned about starting a controlled substance for ADHD due to her past substance abuse. Provider informed patient that there were medications that were not addicting that could help improve her concentration. She endorsed understanding and agreed.   Patient is agreeable to starting Strattera 40 mg to help improve concentration. Patient informed that is  a non-stimulant medication; patient expressed understanding and is agreeable to plan. No other adjustments made today. She will continue all other medications and follow up with outpatient threapy. No other concerns at this time.   Past Medical History: No past medical history on file. No past surgical history on file.  Family Psychiatric History: Biological father Bipolar and Soon ADHD  Family History: No family history on file.  Social History:  Social History   Socioeconomic History  . Marital status: Single    Spouse name: Not on file  . Number of children: Not on file  . Years of education: Not on file  . Highest education level: Not on file  Occupational History  . Not on file  Tobacco Use  . Smoking status: Never Smoker  . Smokeless tobacco: Never Used  Substance and Sexual Activity  . Alcohol use: No    Alcohol/week: 0.0 standard drinks  . Drug use: No    Comment: per patient clean for 2 years 2014  . Sexual activity: Never    Partners: Male  Other Topics Concern  . Not on file  Social History Narrative  . Not on file   Social Determinants of Health   Financial Resource Strain:   .  Difficulty of Paying Living Expenses: Not on file  Food Insecurity:   . Worried About Programme researcher, broadcasting/film/video in the Last Year: Not on file  . Ran Out of Food in the Last Year: Not on file  Transportation Needs:   . Lack of Transportation (Medical):  Not on file  . Lack of Transportation (Non-Medical): Not on file  Physical Activity:   . Days of Exercise per Week: Not on file  . Minutes of Exercise per Session: Not on file  Stress:   . Feeling of Stress : Not on file  Social Connections:   . Frequency of Communication with Friends and Family: Not on file  . Frequency of Social Gatherings with Friends and Family: Not on file  . Attends Religious Services: Not on file  . Active Member of Clubs or Organizations: Not on file  . Attends Banker Meetings: Not on file  . Marital Status: Not on file    Allergies: No Known Allergies  Metabolic Disorder Labs: No results found for: HGBA1C, MPG No results found for: PROLACTIN No results found for: CHOL, TRIG, HDL, CHOLHDL, VLDL, LDLCALC No results found for: TSH  Therapeutic Level Labs: No results found for: LITHIUM No results found for: VALPROATE No components found for:  CBMZ  Current Medications: Current Outpatient Medications  Medication Sig Dispense Refill  . ARIPiprazole (ABILIFY) 10 MG tablet Take 1 tablet (10 mg total) by mouth daily. 30 tablet 2  . atomoxetine (STRATTERA) 40 MG capsule Take 1 capsule (40 mg total) by mouth daily. 30 capsule 2  . FLUoxetine (PROZAC) 20 MG capsule Take 2 capsules (40 mg total) by mouth daily. 60 capsule 2   No current facility-administered medications for this visit.     Musculoskeletal: Strength & Muscle Tone: Unable to assess due to telehalth Gait & Station: Unable to assess due to telehalth Patient leans: N/A  Psychiatric Specialty Exam: Review of Systems  There were no vitals taken for this visit.There is no height or weight on file to calculate BMI.  General Appearance: Well Groomed  Eye Contact:  Good  Speech:  Clear and Coherent and Normal Rate  Volume:  Normal  Mood:  Euthymic  Affect:  Congruent  Thought Process:  Coherent, Goal Directed and Linear  Orientation:  Full (Time, Place, and Person)  Thought  Content: WDL and Logical   Suicidal Thoughts:  No  Homicidal Thoughts:  No  Memory:  Immediate;   Good Recent;   Good Remote;   Good  Judgement:  Good  Insight:  Good  Psychomotor Activity:  Normal  Concentration:  Concentration: Good and Attention Span: Fair  Recall:  Fair  Fund of Knowledge: Good  Language: Good  Akathisia:  No  Handed:  Right  AIMS (if indicated): Not done  Assets:  Communication Skills Desire for Improvement Financial Resources/Insurance Housing Social Support  ADL's:  Intact  Cognition: WNL  Sleep:  Good   Screenings: PHQ2-9     Office Visit from 11/27/2015 in [facility] for Infectious Disease Office Visit from 07/26/2015 in [facility] for Infectious Disease Office Visit from 03/21/2015 in [facility] for Infectious Disease  PHQ-2 Total Score 0 0 0       Assessment and Plan: Patient notes that her anxiety and depression has improved since increasing Prozac.  She however notes that she has problems concentrating.  She is agreeable to starting Strattera 40 mg daily to help manage symptoms of  ADHD.  She will continue all other medications as prescribed.  1. Attention deficit hyperactivity disorder (ADHD), predominantly inattentive type  Start- atomoxetine (STRATTERA) 40 MG capsule; Take 1 capsule (40 mg total) by mouth daily.  Dispense: 30 capsule; Refill: 2  2. Bipolar II disorder (HCC)  Continue- ARIPiprazole (ABILIFY) 10 MG tablet; Take 1 tablet (10 mg total) by mouth daily.  Dispense: 30 tablet; Refill: 2 Continue- FLUoxetine (PROZAC) 20 MG capsule; Take 2 capsules (40 mg total) by mouth daily.  Dispense: 60 capsule; Refill: 2  Follow-up in 3 months Follow-up with therapy  Shanna Cisco, NP 05/03/2020, 1:35 PM

## 2020-05-10 ENCOUNTER — Ambulatory Visit (INDEPENDENT_AMBULATORY_CARE_PROVIDER_SITE_OTHER): Payer: No Payment, Other | Admitting: Licensed Clinical Social Worker

## 2020-05-10 ENCOUNTER — Other Ambulatory Visit: Payer: Self-pay

## 2020-05-10 DIAGNOSIS — F3181 Bipolar II disorder: Secondary | ICD-10-CM

## 2020-05-10 DIAGNOSIS — Z634 Disappearance and death of family member: Secondary | ICD-10-CM
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
## 2020-05-31 ENCOUNTER — Ambulatory Visit (INDEPENDENT_AMBULATORY_CARE_PROVIDER_SITE_OTHER): Payer: No Payment, Other | Admitting: Licensed Clinical Social Worker

## 2020-05-31 ENCOUNTER — Other Ambulatory Visit: Payer: Self-pay

## 2020-05-31 DIAGNOSIS — F3181 Bipolar II disorder: Secondary | ICD-10-CM

## 2020-06-02 NOTE — Progress Notes (Signed)
   THERAPIST PROGRESS NOTE   Virtual Visit via Video Note  I connected with Chloe Mitchell on 05/31/20 at  8:00 AM EDT by a video enabled telemedicine application and verified that I am speaking with the correct person using two identifiers.  Location: Patient: Work, in a [employer], quantity. Provider: [facility]   I discussed the limitations of evaluation and management by telemedicine and the availability of in person appointments. The patient expressed understanding and agreed to proceed. I discussed the assessment and treatment plan with the patient. The patient was provided an opportunity to ask questions and all were answered. The patient agreed with the plan and demonstrated an understanding of the instructions.  I provided 45 minutes of non-face-to-face time during this encounter.  Participation Level: Active  Behavioral Response: Well GroomedAlertAnxious  Type of Therapy: Individual Therapy  Treatment Goals addressed: Anxiety and Coping  Interventions: Solution Focused, Supportive and Other: Grief counseling  Summary: Chloe Mitchell is a 29 y.o. female who presents with hx of Bipolar Disorder. This date pt signs on for video session per her preference. This is the first session since initial assessment. Pt reports she is doing "okay". She advises her boyfriend's testing for Leukemia came back negative. He is continuing to have health problems, which is an ongoing stressor, but pt relieved he was not dx with Leukemia. Pt advises she has been on Strattera as prescribed for a month tomorrow and does believe this is helping her. She states her focus and productivity seem to be better, sleeping better. LCSW assesed for status of grief r/t loss of mother and stepfather from COVID. Pt advises she has been overwhelmed with tasks r/t settling of estate with no Last Will and Testament. She reports her parents home is now completely cleared out, was listed with a realtor and there is a  current contract. She provides many more details r/t communications with her step brother, Onalee Hua, who has been extremely inappropriate and "difficult". Step bro is currently in [facility] but was living in the home of her parents. She was concerned she was not going to be able to get him to leave. He has left but took many belongings from her parents home with no communication. He also cleared out his father's bank account while father was still living but incapacitated. Pt struggling to decide if she will give him any portion of the selling of the home, which she is not required to do by law. He is aware the home is under contract and is already calling her about his share. LCSW spent remainder of session helping pt to process situation and determine options. She will use a written decision making strategy LCSW provided education on in addition to gathering more information from attorney. Pt advises she has still had little opportunity to grieve. She states she does have a [facility] appt for her son next wk. She advises her group counselor at [facility] did not f/u with her for last expected group. LCSW provided grief support and invited pt to a group for coping with the holidays which she states she will plan to attend. LCSW reviewed poc with pt's verbal agreement prior to close of session. Pt states appreciation for care.  Suicidal/Homicidal: Nowithout intent/plan  Therapist Response: Pt remains receptive to care.  Plan: Return again in 2 weeks.  Diagnosis: Axis I: Bipolar, mixed    Axis II: Deferred  Goodman Sink, LCSW 06/02/2020

## 2020-06-14 ENCOUNTER — Ambulatory Visit (INDEPENDENT_AMBULATORY_CARE_PROVIDER_SITE_OTHER): Payer: No Payment, Other | Admitting: Licensed Clinical Social Worker

## 2020-06-14 ENCOUNTER — Other Ambulatory Visit: Payer: Self-pay

## 2020-06-14 DIAGNOSIS — F3181 Bipolar II disorder: Secondary | ICD-10-CM

## 2020-06-15 NOTE — Progress Notes (Signed)
   THERAPIST PROGRESS NOTE   Virtual Visit via Video Note  I connected with Chloe Mitchell on 06/14/20 at  8:00 AM EDT by a video enabled telemedicine application and verified that I am speaking with the correct person using two identifiers.  Location: Patient: At work in private office. Provider: [facility]   I discussed the limitations of evaluation and management by telemedicine and the availability of in person appointments. The patient expressed understanding and agreed to proceed. I discussed the assessment and treatment plan with the patient. The patient was provided an opportunity to ask questions and all were answered. The patient agreed with the plan and demonstrated an understanding of the instructions.   I provided 45 minutes of non-face-to-face time during this encounter.  Participation Level: Active  Behavioral Response: Well GroomedAlertMildy depressed and anxious  Type of Therapy: Individual Therapy  Treatment Goals addressed: Coping  Interventions: Motivational Interviewing and Supportive  Summary: Chloe Mitchell is a 29 y.o. female who presents with hx of Bipolar Dis. This date pt signs on for video session per her preference. Pt provides update on situation with her mother/stepfather's home. Sell of home fell through but pt now has a better offer pending with cash. She reports her stepbro has not been hounding her for money and they have not talked but she is "200% sure" he will take her to court if she does not offer him what he considers fair. Pt used decision making strategy LCSW provided and states it was helpful. She intends to ask him for receipts for everything he says he sold and what he cleared from bank account. She is doubtful he can produce anything. Admits this ongoing stressor is draining. Remainder of session spent on new concerns r/t her relationship with her boyfriend of 15 mon and whether or not they will become married as well as how their  relationship is going. Pt states her boyfriend, Chloe Mitchell, is "an extreme perfectionist". He has concerns about her shopping habits pt admits can be poor but she is working on same. She states when they argue ~ once every 4-5 mon she is usually the one making up. She says last time they argued he said "We're done". She states her grandmother and aunt do not like her boyfriend but cannot provide details and they have recently created doubts for her with negative comments they have made. She states they have barely been around him and she does not talk about their relationship with them. Pt agrees she is a people pleaser when asked. Admits she has a hard time trusting her own judgement. LCSW assisted to process thoughts and feelings. Addressed communications and strategies she could consider to gather more info from [place]. Pt verbalizes understanding and agreement. LCSW introduced education on self talk. Facilitated literature being mailed to pt r/t self talk to further address next session. LCSW reviewed poc and coping prior to close of session. Pt states appreciation for care.      Suicidal/Homicidal: Nowithout intent/plan  Therapist Response: Pt remains receptive to care.  Plan: Return again in 2 weeks.  Diagnosis: Axis I: Bipolar, mixed    Axis II: Deferred   Sink, LCSW 06/15/2020

## 2020-06-27 ENCOUNTER — Ambulatory Visit (HOSPITAL_COMMUNITY): Payer: Self-pay | Admitting: Licensed Clinical Social Worker

## 2020-06-27 ENCOUNTER — Other Ambulatory Visit: Payer: Self-pay

## 2020-06-27 ENCOUNTER — Telehealth (HOSPITAL_COMMUNITY): Payer: Self-pay | Admitting: Licensed Clinical Social Worker

## 2020-06-27 NOTE — Telephone Encounter (Signed)
LCSW sent text with video link for scheduled session. When pt failed to sign on LCSW called pt. Call went to vm. LCSW left detailed message re purpose of call and requested call back to let clinician know how she is.

## 2020-07-25 ENCOUNTER — Other Ambulatory Visit (HOSPITAL_COMMUNITY): Payer: Self-pay | Admitting: Psychiatry

## 2020-07-25 DIAGNOSIS — F900 Attention-deficit hyperactivity disorder, predominantly inattentive type: Secondary | ICD-10-CM

## 2020-08-02 ENCOUNTER — Telehealth (HOSPITAL_COMMUNITY): Payer: No Payment, Other | Admitting: Psychiatry

## 2020-08-15 ENCOUNTER — Telehealth (HOSPITAL_COMMUNITY): Payer: Self-pay | Admitting: *Deleted

## 2020-08-15 ENCOUNTER — Other Ambulatory Visit (HOSPITAL_COMMUNITY): Payer: Self-pay | Admitting: Psychiatry

## 2020-08-15 DIAGNOSIS — F900 Attention-deficit hyperactivity disorder, predominantly inattentive type: Secondary | ICD-10-CM

## 2020-08-15 DIAGNOSIS — F3181 Bipolar II disorder: Secondary | ICD-10-CM

## 2020-08-15 MED ORDER — ARIPIPRAZOLE 10 MG PO TABS: 10.0000 mg | ORAL_TABLET | Freq: Every day | ORAL | 2 refills | Status: DC

## 2020-08-15 MED ORDER — ATOMOXETINE HCL 40 MG PO CAPS: 40.0000 mg | ORAL_CAPSULE | Freq: Every day | ORAL | 2 refills | Status: DC

## 2020-08-15 MED ORDER — FLUOXETINE HCL 20 MG PO CAPS: 40.0000 mg | ORAL_CAPSULE | Freq: Every day | ORAL | 2 refills | Status: DC

## 2020-08-15 NOTE — Telephone Encounter (Signed)
VM from patient stating she has lost all of her medicines. She is not scheduled to be seen by a provider till Feb after we cx her appt in 12/21. She called [place] her pharmacy first and they directed her to call us for a new rx and then to call them and give verbal permission for her to have it released early. I called [place] and they confirmed her story and also checked her record to see if early release was a pattern and it is not. Will ask Toy Cookey NP to call in new Rxs if she thinks it is reasonable.

## 2020-08-15 NOTE — Telephone Encounter (Signed)
Medications refilled and sent to preferred pharmacy.

## 2020-09-11 ENCOUNTER — Encounter (HOSPITAL_COMMUNITY): Payer: Self-pay | Admitting: Psychiatry

## 2020-09-11 ENCOUNTER — Other Ambulatory Visit: Payer: Self-pay

## 2020-09-11 ENCOUNTER — Telehealth (INDEPENDENT_AMBULATORY_CARE_PROVIDER_SITE_OTHER): Payer: No Payment, Other | Admitting: Psychiatry

## 2020-09-11 DIAGNOSIS — F3181 Bipolar II disorder: Secondary | ICD-10-CM

## 2020-09-11 DIAGNOSIS — F900 Attention-deficit hyperactivity disorder, predominantly inattentive type: Secondary | ICD-10-CM

## 2020-09-11 MED ORDER — FLUOXETINE HCL 20 MG PO CAPS: 40.0000 mg | ORAL_CAPSULE | Freq: Every day | ORAL | 2 refills | Status: DC

## 2020-09-11 MED ORDER — ATOMOXETINE HCL 80 MG PO CAPS: 80.0000 mg | ORAL_CAPSULE | Freq: Every day | ORAL | 2 refills | Status: DC

## 2020-09-11 MED ORDER — ARIPIPRAZOLE 10 MG PO TABS: 10.0000 mg | ORAL_TABLET | Freq: Every day | ORAL | 2 refills | Status: DC

## 2020-09-11 NOTE — Progress Notes (Addendum)
BH MD/PA/NP OP Progress Note Virtual Visit via Video Note  I connected with Chloe Mitchell on 10/18/20 at  1:00 PM EST by a video enabled telemedicine application and verified that I am speaking with the correct person using two identifiers.  Location: Patient: Home Provider: Clinic   I discussed the limitations of evaluation and management by telemedicine and the availability of in person appointments. The patient expressed understanding and agreed to proceed.  I provided 30 minutes of non-face-to-face time during this encounter.    09/11/2020 1:19 PM Chloe Mitchell  MRN:  [id number]  Chief Complaint: "I have seen improvement on the Strattera but within the last month I've had problems concentrating"  HPI: 30 year-old female seen today for follow up psychiatric evaluation. 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 She is currently being managed on Strattera 40 mg daily, Abilify 10mg  daily and Prozac 40mg  daily.  Today patient is pleasant, calm, cooperative, maintained eye contact. and engaged in conversation.She notes that she has minimal anxiety and depression. Provider conducted a GAD 7 and patient scored a 3. Provider also conducted a PHQ 9 and patient scored a 2. She notes that her family is going well and so is work.Today she endorsee adequate sleep (noting that she sleeps 6-7 hours on melatonin) and appetite.   She denies SI/HI/AVH or paranoia.  Patient informed provider that when she first started Strattera she noticed her concentration improved drastically. She however informed provider that over the last month she has had poor concentration. Today she is agreeable to increasing Strattera 40 mg to 80 mg tohelp improve concentration. No other adjustments made today. She will continue all other medications and follow up with outpatient threapy. No other concerns at this time.  Visit Diagnosis:     ICD-10-CM   1. Bipolar II disorder (HCC)  F31.81 ARIPiprazole (ABILIFY) 10 MG tablet    FLUoxetine (PROZAC) 20 MG capsule  2. 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  Past Medical History: No past medical history on file. No past surgical history on file.  Family Psychiatric History: Biological father Bipolar and Soon ADHD  Family History: No family history on file.  Social History:  Social History   Socioeconomic History  . Marital status: Single    Spouse name: Not on file  . Number of children: Not on file  . Years of education: Not on file  . Highest education level: Not on file  Occupational History  . Not on file  Tobacco Use  . Smoking status: Never Smoker  . Smokeless tobacco: Never Used  Substance and Sexual Activity  . Alcohol use: No    Alcohol/week: 0.0 standard drinks  . Drug use: No    Comment: per patient clean for 2 years 2014  . Sexual activity: Never    Partners: Male  Other Topics Concern  . Not on file  Social History Narrative  . Not on file   Social Determinants of Health   Financial Resource Strain: Not on file  Food Insecurity: Not on file  Transportation Needs: Not on file  Physical Activity: Not on file  Stress: Not on file  Social Connections: Not on file    Allergies: No Known Allergies  Metabolic Disorder Labs: No results found for: HGBA1C,  MPG No results found for: PROLACTIN No results found for: CHOL, TRIG, HDL, CHOLHDL, VLDL, LDLCALC No results found for: TSH  Therapeutic Level Labs: No results found for: LITHIUM No results found for: VALPROATE No components found for:  CBMZ  Current Medications: Current Outpatient Medications  Medication Sig Dispense Refill  . ARIPiprazole (ABILIFY) 10 MG tablet Take 1 tablet (10 mg  total) by mouth daily. 30 tablet 2  . atomoxetine (STRATTERA) 80 MG capsule Take 1 capsule (80 mg total) by mouth daily. 30 capsule 2  . FLUoxetine (PROZAC) 20 MG capsule Take 2 capsules (40 mg total) by mouth daily. 60 capsule 2   No current facility-administered medications for this visit.     Musculoskeletal: Strength & Muscle Tone: Unable to assess due to telehalth Gait & Station: Unable to assess due to telehalth Patient leans: N/A  Psychiatric Specialty Exam: Review of Systems  There were no vitals taken for this visit.There is no height or weight on file to calculate BMI.  General Appearance: Well Groomed  Eye Contact:  Good  Speech:  Clear and Coherent and Normal Rate  Volume:  Normal  Mood:  Euthymic  Affect:  Appropriate and Congruent  Thought Process:  Coherent, Goal Directed and Linear  Orientation:  Full (Time, Place, and Person)  Thought Content: WDL and Logical   Suicidal Thoughts:  No  Homicidal Thoughts:  No  Memory:  Immediate;   Good Recent;   Good Remote;   Good  Judgement:  Good  Insight:  Good  Psychomotor Activity:  Normal  Concentration:  Concentration: Good and Attention Span: Good  Recall:  Good  Fund of Knowledge: Good  Language: Good  Akathisia:  No  Handed:  Right  AIMS (if indicated): Not done  Assets:  Communication Skills Desire for Improvement Financial Resources/Insurance Housing Social Support  ADL's:  Intact  Cognition: WNL  Sleep:  Good   Screenings: GAD-7   Flowsheet Row Video Visit from 09/11/2020 in [facility]  Total GAD-7 Score 3    PHQ2-9   Flowsheet Row Video Visit from 09/11/2020 in [facility] Office Visit from 11/27/2015 in [facility] for Infectious [facility] Office Visit from 07/26/2015 in [facility] for [facility]ious Disease Office Visit from 03/21/2015 in [facility] for Infectious Disease  PHQ-2 Total Score 0 0 0 0   PHQ-9 Total Score 2 - - -       Assessment and Plan: Patient notes that her anxiety has improved since her last visit however she notes that she is having poor concentration. Today she is agreeable to increase Strattera 40 mg to 80 Mg to help manage symptoms of ADHD. She will continue all other medications as prescribed.  1. Bipolar II disorder (HCC)  Continue- ARIPiprazole (ABILIFY) 10 MG tablet; Take 1 tablet (10 mg total) by mouth daily.  Dispense: 30 tablet; Refill: 2 Continue- FLUoxetine (PROZAC) 20 MG capsule; Take 2 capsules (40 mg total) by mouth daily.  Dispense: 60 capsule; Refill: 2  2. Attention deficit hyperactivity disorder (ADHD), predominantly inattentive type  Increased- atomoxetine (STRATTERA) 80 MG capsule; Take 1 capsule (80 mg total) by mouth daily.  Dispense: 30 capsule; Refill: 2  Follow up in 3 months Follow-up with therapy   Shanna Cisco, NP 09/11/2020, 1:19 PM

## 2020-09-24 ENCOUNTER — Other Ambulatory Visit (HOSPITAL_COMMUNITY): Payer: Self-pay | Admitting: Nurse Practitioner

## 2020-12-07 ENCOUNTER — Telehealth (INDEPENDENT_AMBULATORY_CARE_PROVIDER_SITE_OTHER): Payer: No Payment, Other | Admitting: Psychiatry

## 2020-12-07 ENCOUNTER — Encounter (HOSPITAL_COMMUNITY): Payer: Self-pay | Admitting: Psychiatry

## 2020-12-07 ENCOUNTER — Other Ambulatory Visit: Payer: Self-pay

## 2020-12-07 ENCOUNTER — Other Ambulatory Visit (HOSPITAL_COMMUNITY): Payer: Self-pay

## 2020-12-07 DIAGNOSIS — F3181 Bipolar II disorder: Secondary | ICD-10-CM

## 2020-12-07 DIAGNOSIS — F900 Attention-deficit hyperactivity disorder, predominantly inattentive type: Secondary | ICD-10-CM

## 2020-12-07 MED ORDER — ARIPIPRAZOLE 10 MG PO TABS: 10.0000 mg | ORAL_TABLET | Freq: Every day | ORAL | 2 refills | Status: DC

## 2020-12-07 MED ORDER — ATOMOXETINE HCL 80 MG PO CAPS: 80.0000 mg | ORAL_CAPSULE | Freq: Every day | ORAL | 2 refills | Status: DC

## 2020-12-07 MED ORDER — FLUOXETINE HCL 20 MG PO CAPS: 40.0000 mg | ORAL_CAPSULE | Freq: Every day | ORAL | 2 refills | Status: DC

## 2020-12-07 MED FILL — Medroxyprogesterone Acetate IM Susp 150 MG/ML: INTRAMUSCULAR | 90 days supply | Qty: 1 | Fill #0 | Status: AC

## 2020-12-07 NOTE — Progress Notes (Signed)
BH MD/PA/NP OP Progress Note Virtual Visit via Video Note  I connected with Chloe Mitchell on 12/07/20 at  8:30 AM EDT by a video enabled telemedicine application and verified that I am speaking with the correct person using two identifiers.  Location: Patient: Home Provider: Clinic   I discussed the limitations of evaluation and management by telemedicine and the availability of in person appointments. The patient expressed understanding and agreed to proceed.  I provided 30 minutes of non-face-to-face time during this encounter.    12/07/2020 8:44 AM Chloe Mitchell  MRN:  [id number]  Chief Complaint: "I think every thing is going well"  HPI: 30 year-old female seen today for follow up psychiatric evaluation. 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 She is currently being managed on Strattera 80 mg daily, Abilify 10mg  daily and Prozac 40mg  daily. She notes that he medications are effective in managing her psychiatric conditions.   Today patient is pleasant, calm, cooperative, maintained eye contact and engaged in conversation.She informed provider that everything has been going well.  She is notes that with the increase of Strattera she is able to focus more and get more tasks done.  She reports that her anxiety and depression continues to be minimal.  Today provider conducted a GAD 7 and patient scored a 3, at her last visit she also scored a 3. Provider also conducted a PHQ 9 and patient scored a 2, at her last visit she scored a 2. She endorses adequate sleep  and appetite.   She denies SI/HI/AVH or paranoia.  Patient notes that she is considering starting a new position and is currently nervous about leaving her old position.  She notes that she does not want her boss or appears to be mad at her.  No medication adjustments made today. She will continue all medications and follow up with  outpatient threapy. No other concerns at this time.  Visit Diagnosis:    ICD-10-CM   1. Bipolar II disorder (HCC)  F31.81 ARIPiprazole (ABILIFY) 10 MG tablet    FLUoxetine (PROZAC) 20 MG capsule  2. 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  Past Medical History: No past medical history on file. No past surgical history on file.  Family Psychiatric History: Biological father Bipolar and Soon ADHD  Family History: No family history on file.  Social History:  Social History   Socioeconomic History  . Marital status: Single    Spouse name: Not on file  . Number of children: Not on file  . Years of education: Not on file  . Highest education level: Not on file  Occupational History  . Not on file  Tobacco Use  . Smoking status: Never Smoker  . Smokeless tobacco: Never Used  Substance and Sexual Activity  . Alcohol use: No    Alcohol/week: 0.0 standard drinks  . Drug use: No    Comment: per patient clean for 2 years 2014  . Sexual activity: Never    Partners: Male  Other Topics Concern  . Not on file  Social History Narrative  . Not on file   Social Determinants of Health   Financial Resource Strain: Not on file  Food Insecurity: Not on file  Transportation Needs: Not on file  Physical Activity: Not  on file  Stress: Not on file  Social Connections: Not on file    Allergies: No Known Allergies  Metabolic Disorder Labs: No results found for: HGBA1C, MPG No results found for: PROLACTIN No results found for: CHOL, TRIG, HDL, CHOLHDL, VLDL, LDLCALC No results found for: TSH  Therapeutic Level Labs: No results found for: LITHIUM No results found for: VALPROATE No components found for:  CBMZ  Current Medications: Current Outpatient Medications  Medication Sig  Dispense Refill  . ARIPiprazole (ABILIFY) 10 MG tablet Take 1 tablet (10 mg total) by mouth daily. 30 tablet 2  . atomoxetine (STRATTERA) 80 MG capsule Take 1 capsule (80 mg total) by mouth daily. 30 capsule 2  . FLUoxetine (PROZAC) 20 MG capsule Take 2 capsules (40 mg total) by mouth daily. 60 capsule 2  . medroxyPROGESTERone (DEPO-PROVERA) 150 MG/ML injection INJECT 1 ML INTRAMUSCULAR EVERY 3 MONTHS 90 DAYS 1 mL 3   No current facility-administered medications for this visit.     Musculoskeletal: Strength & Muscle Tone: Unable to assess due to telehalth Gait & Station: Unable to assess due to telehalth Patient leans: N/A  Psychiatric Specialty Exam: Review of Systems  There were no vitals taken for this visit.There is no height or weight on file to calculate BMI.  General Appearance: Well Groomed  Eye Contact:  Good  Speech:  Clear and Coherent and Normal Rate  Volume:  Normal  Mood:  Euthymic  Affect:  Appropriate and Congruent  Thought Process:  Coherent, Goal Directed and Linear  Orientation:  Full (Time, Place, and Person)  Thought Content: WDL and Logical   Suicidal Thoughts:  No  Homicidal Thoughts:  No  Memory:  Immediate;   Good Recent;   Good Remote;   Good  Judgement:  Good  Insight:  Good  Psychomotor Activity:  Normal  Concentration:  Concentration: Good and Attention Span: Good  Recall:  Good  Fund of Knowledge: Good  Language: Good  Akathisia:  No  Handed:  Right  AIMS (if indicated): Not done  Assets:  Communication Skills Desire for Improvement Financial Resources/Insurance Housing Social Support  ADL's:  Intact  Cognition: WNL  Sleep:  Good   Screenings: GAD-7   Flowsheet Row Video Visit from 12/07/2020 in [facility] Video Visit from 09/11/2020 in [facility]  Total GAD-7 Score 3 3    PHQ2-9   Flowsheet Row Video Visit from 12/07/2020 in [facility] Video  Visit from 09/11/2020 in [facility] Office Visit from 11/27/2015 in [facility] for Infectious Disease Office Visit from 07/26/2015 in [facility] for Infectious Disease Office Visit from 03/21/2015 in [facility] for Infectious Disease  PHQ-2 Total Score 1 0 0 0 0  PHQ-9 Total Score 2 2 -- -- --    Flowsheet Row Video Visit from 12/07/2020 in [facility]  C-SSRS RISK CATEGORY No Risk       Assessment and Plan: Patient notes that she is doing well on her current medication regimen.  No medication changes made today.  Patient agreeable to continue medication as prescribed. 1. Bipolar II disorder (HCC)  Continue- ARIPiprazole (ABILIFY) 10 MG tablet; Take 1 tablet (10 mg total) by mouth daily.  Dispense: 30 tablet; Refill: 2 Continue- FLUoxetine (PROZAC) 20 MG capsule; Take 2 capsules (40 mg total) by mouth daily.  Dispense: 60 capsule; Refill: 2  2. Attention deficit hyperactivity  disorder (ADHD), predominantly inattentive type  Continue- atomoxetine (STRATTERA) 80 MG capsule; Take 1 capsule (80 mg total) by mouth daily.  Dispense: 30 capsule; Refill: 2  Follow up in 3 months Follow-up with therapy   Shanna Cisco, NP 12/07/2020, 8:44 AM

## 2020-12-10 ENCOUNTER — Other Ambulatory Visit (HOSPITAL_COMMUNITY): Payer: Self-pay

## 2020-12-10 ENCOUNTER — Telehealth (HOSPITAL_COMMUNITY): Payer: No Payment, Other | Admitting: Psychiatry

## 2020-12-11 ENCOUNTER — Other Ambulatory Visit (HOSPITAL_COMMUNITY): Payer: Self-pay

## 2021-02-25 ENCOUNTER — Other Ambulatory Visit (HOSPITAL_COMMUNITY): Payer: Self-pay

## 2021-02-25 MED ORDER — MEDROXYPROGESTERONE ACETATE 150 MG/ML IM SUSY
150.0000 mg | PREFILLED_SYRINGE | INTRAMUSCULAR | 1 refills | Status: AC
  Filled 2021-02-25: qty 1, 90d supply, fill #0

## 2021-02-25 MED FILL — Medroxyprogesterone Acetate IM Susp 150 MG/ML: INTRAMUSCULAR | 90 days supply | Qty: 1 | Fill #1 | Status: CN

## 2021-02-26 ENCOUNTER — Other Ambulatory Visit (HOSPITAL_COMMUNITY): Payer: Self-pay

## 2021-03-07 ENCOUNTER — Telehealth (INDEPENDENT_AMBULATORY_CARE_PROVIDER_SITE_OTHER): Payer: No Payment, Other | Admitting: Psychiatry

## 2021-03-07 ENCOUNTER — Encounter (HOSPITAL_COMMUNITY): Payer: Self-pay | Admitting: Psychiatry

## 2021-03-07 ENCOUNTER — Other Ambulatory Visit: Payer: Self-pay

## 2021-03-07 DIAGNOSIS — F3181 Bipolar II disorder: Secondary | ICD-10-CM | POA: Diagnosis not present

## 2021-03-07 DIAGNOSIS — F900 Attention-deficit hyperactivity disorder, predominantly inattentive type: Secondary | ICD-10-CM

## 2021-03-07 MED ORDER — ATOMOXETINE HCL 80 MG PO CAPS: 80.0000 mg | ORAL_CAPSULE | Freq: Every day | ORAL | 3 refills | Status: DC

## 2021-03-07 MED ORDER — FLUOXETINE HCL 20 MG PO CAPS: 40.0000 mg | ORAL_CAPSULE | Freq: Every day | ORAL | 3 refills | Status: DC

## 2021-03-07 MED ORDER — ARIPIPRAZOLE 10 MG PO TABS: 10.0000 mg | ORAL_TABLET | Freq: Every day | ORAL | 3 refills | Status: DC

## 2021-03-07 NOTE — Progress Notes (Signed)
BH MD/PA/NP OP Progress Note Virtual Visit via Video Note  I connected with Chloe Mitchell on 03/07/21 at  1:00 PM EDT by a video enabled telemedicine application and verified that I am speaking with the correct person using two identifiers.  Location: Patient: Work Provider: Clinic   I discussed the limitations of evaluation and management by telemedicine and the availability of in person appointments. The patient expressed understanding and agreed to proceed.  I provided 30 minutes of non-face-to-face time during this encounter.    03/07/2021 1:07 PM Chloe Mitchell  MRN:  [id number]  Chief Complaint: "I feel like what I am on is working"  HPI: 30 year-old female seen today for follow up psychiatric evaluation. 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 She is currently being managed on Strattera 80 mg daily, Abilify 10mg  daily and Prozac 40mg  daily. She notes that he medications are effective in managing her psychiatric conditions.    Today patient is pleasant, calm, cooperative, maintained eye contact and engaged in conversation.She informed provider that she is on her lunch break at work.  She notes that she continues to find enjoyment in her job.  She reports that life changes has been positive recently.  She informed that recently she moved to .  She informed Clinical research associate that her anxiety depression continues to be minimal.  Provider conducted a GAD-7 and patient scored a 2, at her last visit she scored a 3.  Provider also conducted a PHQ-9 and patient scored a 4, at her last visit she scored a 2.  Patient notes that she sleeps approximately 10 hours nightly.  She endorses adequate appetite.  She notes her mood is stable and denies SI/HI/VAH, mania, or paranoia.    No medication adjustments made today. She will continue all medications and follow up with outpatient threapy. No other  concerns at this time.  Visit Diagnosis:    ICD-10-CM   1. Bipolar II disorder (HCC)  F31.81 ARIPiprazole (ABILIFY) 10 MG tablet    FLUoxetine (PROZAC) 20 MG capsule    2. 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  Past Medical History: History reviewed. No pertinent past medical history. History reviewed. No pertinent surgical history.  Family Psychiatric History: Biological father Bipolar and Soon ADHD  Family History: History reviewed. No pertinent family history.  Social History:  Social History   Socioeconomic History   Marital status: Single    Spouse name: Not on file   Number of children: Not on file   Years of education: Not on file   Highest education level: Not on file  Occupational History   Not on file  Tobacco Use   Smoking status: Never   Smokeless tobacco: Never  Substance and Sexual Activity   Alcohol use: No    Alcohol/week: 0.0 standard drinks   Drug use: No    Comment: per patient clean for 2 years 2014   Sexual activity: Never    Partners: Male  Other Topics Concern   Not on file  Social History Narrative   Not on file   Social Determinants of Health   Financial Resource Strain: Not on file  Food Insecurity: Not on file  Transportation Needs: Not on file  Physical Activity: Not on file  Stress: Not on file  Social Connections: Not on file    Allergies: No Known Allergies  Metabolic Disorder Labs: No results found for: HGBA1C, MPG No results found for: PROLACTIN No results found for: CHOL, TRIG, HDL, CHOLHDL, VLDL, LDLCALC No results found for: TSH  Therapeutic Level Labs: No results found for: LITHIUM No results found for: VALPROATE No components found for:  CBMZ  Current Medications: Current Outpatient Medications  Medication  Sig Dispense Refill   ARIPiprazole (ABILIFY) 10 MG tablet Take 1 tablet (10 mg total) by mouth daily. 30 tablet 3   atomoxetine (STRATTERA) 80 MG capsule Take 1 capsule (80 mg total) by mouth daily. 30 capsule 3   FLUoxetine (PROZAC) 20 MG capsule Take 2 capsules (40 mg total) by mouth daily. 60 capsule 3   medroxyPROGESTERone (DEPO-PROVERA) 150 MG/ML injection INJECT 1 ML INTRAMUSCULAR EVERY 3 MONTHS 90 DAYS 1 mL 3   medroxyPROGESTERone Acetate 150 MG/ML SUSY Inject 1 mL (150 mg total) into the muscle every 3 (three) months. 1 mL 1   No current facility-administered medications for this visit.     Musculoskeletal: Strength & Muscle Tone:  Unable to assess due to telehalth Gait & Station:  Unable to assess due to telehalth Patient leans: N/A  Psychiatric Specialty Exam: Review of Systems  There were no vitals taken for this visit.There is no height or weight on file to calculate BMI.  General Appearance: Well Groomed  Eye Contact:  Good  Speech:  Clear and Coherent and Normal Rate  Volume:  Normal  Mood:  Euthymic  Affect:  Appropriate and Congruent  Thought Process:  Coherent, Goal Directed and Linear  Orientation:  Full (Time, Place, and Person)  Thought Content: WDL and Logical   Suicidal Thoughts:  No  Homicidal Thoughts:  No  Memory:  Immediate;   Good Recent;   Good Remote;   Good  Judgement:  Good  Insight:  Good  Psychomotor Activity:  Normal  Concentration:  Concentration: Good and Attention Span: Good  Recall:  Good  Fund of Knowledge: Good  Language: Good  Akathisia:  No  Handed:  Right  AIMS (if indicated): Not done  Assets:  Communication Skills Desire for Improvement Financial Resources/Insurance Housing Social Support  ADL's:  Intact  Cognition: WNL  Sleep:  Good   Screenings: GAD-7    Flowsheet Row Video Visit from 03/07/2021 in [facility] Video Visit from 12/07/2020 in [facility] Video  Visit from 09/11/2020 in [facility]  Total GAD-7 Score 2 3 3       PHQ2-9    Flowsheet Row Video Visit from 03/07/2021 in [facility] Video Visit from 12/07/2020 in [facility] Video Visit from 09/11/2020 in [facility] Office Visit from 11/27/2015 in [facility] Disease Office Visit from 07/26/2015 in [facility] for Infectious Disease  PHQ-2 Total Score 2 1 0 0 0  PHQ-9 Total Score 4 2 2  -- --      Flowsheet Row Video Visit from 12/07/2020 in [facility]  C-SSRS RISK CATEGORY No Risk        Assessment and Plan: Patient notes that she is doing well on her current medication regimen.  No medication changes made today.  Patient agreeable to continue medication as prescribed. 1. Bipolar II disorder (HCC)  Continue- ARIPiprazole (ABILIFY) 10 MG tablet; Take 1 tablet (  10 mg total) by mouth daily.  Dispense: 30 tablet; Refill: 2 Continue- FLUoxetine (PROZAC) 20 MG capsule; Take 2 capsules (40 mg total) by mouth daily.  Dispense: 60 capsule; Refill: 2  2. Attention deficit hyperactivity disorder (ADHD), predominantly inattentive type  Continue- atomoxetine (STRATTERA) 80 MG capsule; Take 1 capsule (80 mg total) by mouth daily.  Dispense: 30 capsule; Refill: 2  Follow up in 3 months Follow-up with therapy   Shanna Cisco, NP 03/07/2021, 1:07 PM

## 2021-05-14 ENCOUNTER — Telehealth (HOSPITAL_COMMUNITY): Payer: Self-pay | Admitting: Psychiatry

## 2021-05-14 NOTE — Telephone Encounter (Signed)
Patient reports going to [facility] yesterday to assist with coming off KRATOM & pt was told to ask Doyne Keel to provide medication to assist pt while going through withdrawals.

## 2021-05-14 NOTE — Telephone Encounter (Signed)
Provider called patient who notes that she has been addicted to kratom since last November.  She notes that she has been going to the [facility] however they referred her back to Clinical research [facility].  Provider informed patient that she should continue to go to the [facility], get an appoint with a PCP to monitor changes while she reduces her consumption of kratom, and follow-up with Clinical research associate as needed for psychiatric care.  She endorsed understanding and agreed.  She notes that she will try to reduce her consumption over the next few weeks and follow-up with the above providers as needed.  No other concerns at this time

## 2021-05-20 ENCOUNTER — Telehealth (HOSPITAL_COMMUNITY): Payer: Self-pay | Admitting: *Deleted

## 2021-05-20 NOTE — Telephone Encounter (Signed)
Writer called patient who notes that she has been feeling better since starting the gabapentin.  She notes that she will follow back up with the provider at the [facility] on Friday.  Provider encouraged patient to discuss continuing gabapentin with the provider at the [facility].  She endorsed understanding and agreed.  Provider noted that if gabapentin is not refilled and patient wants to continue the medications writer will take over prescribing it.  She endorsed understanding and agreed.  No other concerns at this time

## 2021-05-20 NOTE — Telephone Encounter (Signed)
PATIENT LVM STATING THAT SHE RECEIVING OUTPATIENT SAIOP THERAPY @ [facility]  & SAYS THEY ORDER 1 TIME SCRIPT FOR GABAPENTIN 400 MG  & THAT IT HAS HELPED SOMEWHAT. AND THAT THE ORDERING PROVIDERS STATED THAT HE DIDN'T WANT TO STEP ON ANYONE'S TOES & WOULDN'T BE ABLE TO CONTINUE TO ORDER THE GABAPENTIN.  PATIENT IS NOW GETTING CONCERNED & ASKED IF YOU WOULD RETURN HER CALL SHE WANTS TO KNOW WOULD YOU BE ABLE TO CONTINUE ORDERING HER GABAPENTIN??

## 2021-05-28 ENCOUNTER — Other Ambulatory Visit: Payer: Self-pay

## 2021-05-28 ENCOUNTER — Encounter: Payer: Self-pay | Admitting: Adult Health

## 2021-05-28 ENCOUNTER — Ambulatory Visit (INDEPENDENT_AMBULATORY_CARE_PROVIDER_SITE_OTHER): Payer: 59 | Admitting: Adult Health

## 2021-05-28 VITALS — BP 146/98 | HR 91 | Ht 69.0 in | Wt 183.0 lb

## 2021-05-28 DIAGNOSIS — F411 Generalized anxiety disorder: Secondary | ICD-10-CM | POA: Diagnosis not present

## 2021-05-28 DIAGNOSIS — Z634 Disappearance and death of family member: Secondary | ICD-10-CM

## 2021-05-28 DIAGNOSIS — F900 Attention-deficit hyperactivity disorder, predominantly inattentive type: Secondary | ICD-10-CM

## 2021-05-28 DIAGNOSIS — F3181 Bipolar II disorder: Secondary | ICD-10-CM | POA: Diagnosis not present

## 2021-05-28 DIAGNOSIS — F4010 Social phobia, unspecified: Secondary | ICD-10-CM

## 2021-05-28 MED ORDER — ARIPIPRAZOLE 10 MG PO TABS: 10.0000 mg | ORAL_TABLET | Freq: Every day | ORAL | 3 refills | Status: DC

## 2021-05-28 MED ORDER — FLUOXETINE HCL 40 MG PO CAPS: 40.0000 mg | ORAL_CAPSULE | Freq: Every day | ORAL | 3 refills | Status: DC

## 2021-05-28 MED ORDER — GABAPENTIN 300 MG PO CAPS: 300.0000 mg | ORAL_CAPSULE | Freq: Three times a day (TID) | ORAL | 3 refills | Status: DC

## 2021-05-28 MED ORDER — ATOMOXETINE HCL 25 MG PO CAPS: 25.0000 mg | ORAL_CAPSULE | Freq: Every day | ORAL | 7 refills | Status: DC

## 2021-05-28 NOTE — Progress Notes (Signed)
[facility] MD/PA/NP Initial Note  05/28/2021 12:27 PM Chloe Mitchell  MRN:  [id number]  Chief Complaint:   HPI:   Referred by [facility].  Describes mood today as "ok". Pleasant. Tearful at times. Mood symptoms - denies depression, anxiety, and irritability. Mood stable - "consistent". Stating "I feel like I'm doing alright". Has been stable on current medications for over a year and would like to continue. Recent issues after using Kratom - started last November and eventually became an everyday use. Stopped 2 weeks ago. Has struggled with discontinuation, but is feeling better. Most recently seen at the [facility] for evaluation and is involved in an IOP program along with therapy. Reports mood declining after the loss of both parents to Covid last year. Previous history of addiction issues related to Heroin 7 years prior requiring a detox. Feels like she is in a better place with the added support. Stable interest and motivation. Taking medications as prescribed.  Energy levels - "decent". Active, does not have a regular exercise routine. Works full-time. Enjoys some usual interests and activities. Single. Lives with partner. Has a son - age 29. Spending time with family. Appetite adequate. Weight loss - 183.8 pounds. Sleeps moderately. Averages 6 to 7 hours. Focus and concentration worse now than when she was younger. Completing tasks. Managing aspects of household. 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 Denies SI or HI.  Denies AH or VH.  Previous medication trials: Zoloft  Visit Diagnosis:    ICD-10-CM   1. Bipolar II disorder (HCC)  F31.81 FLUoxetine (PROZAC) 40 MG capsule    ARIPiprazole (ABILIFY) 10 MG tablet    2. Attention deficit hyperactivity disorder (ADHD), predominantly inattentive type  F90.0 atomoxetine (STRATTERA) 25 MG capsule    3. Bereavement  Z63.4 FLUoxetine (PROZAC) 40 MG capsule    4. Generalized anxiety disorder  F41.1 FLUoxetine  (PROZAC) 40 MG capsule    gabapentin (NEURONTIN) 300 MG capsule    5. Social anxiety disorder  F40.10 FLUoxetine (PROZAC) 40 MG capsule      Past Psychiatric History: 2014 - Drug detox at [facility]. Denies heroin use x 7 and 1/2 years ago.   Past Medical History: No past medical history on file. No past surgical history on file.  Family Psychiatric History: Denies any diagnosed family history of mental illness.   Family History: No family history on file.  Social History:  Social History   Socioeconomic History   Marital status: Single    Spouse name: Not on file   Number of children: Not on file   Years of education: Not on file   Highest education level: Not on file  Occupational History   Not on file  Tobacco Use   Smoking status: Never   Smokeless tobacco: Never  Substance and Sexual Activity   Alcohol use: No    Alcohol/week: 0.0 standard drinks   Drug use: No    Comment: per patient clean for 2 years 2014   Sexual activity: Never    Partners: Male  Other Topics Concern   Not on file  Social History Narrative   Not on file   Social Determinants of Health   Financial Resource Strain: Not on file  Food Insecurity: Not on file  Transportation Needs: Not on file  Physical Activity: Not on file  Stress: Not on file  Social Connections: Not on file    Allergies: No Known Allergies  Metabolic Disorder Labs: No results found for: HGBA1C, MPG No  results found for: PROLACTIN No results found for: CHOL, TRIG, HDL, CHOLHDL, VLDL, LDLCALC No results found for: TSH  Therapeutic Level Labs: No results found for: LITHIUM No results found for: VALPROATE No components found for:  CBMZ  Current Medications: Current Outpatient Medications  Medication Sig Dispense Refill   atomoxetine (STRATTERA) 25 MG capsule Take 1 capsule (25 mg total) by mouth daily. 7 capsule 7   gabapentin (NEURONTIN) 300 MG capsule Take 1 capsule (300 mg total) by mouth 3 (three) times daily.  270 capsule 3   ARIPiprazole (ABILIFY) 10 MG tablet Take 1 tablet (10 mg total) by mouth daily. 90 tablet 3   atomoxetine (STRATTERA) 80 MG capsule Take 1 capsule (80 mg total) by mouth daily. 30 capsule 3   FLUoxetine (PROZAC) 40 MG capsule Take 1 capsule (40 mg total) by mouth daily. 90 capsule 3   gabapentin (NEURONTIN) 100 MG capsule Take 100-200 mg by mouth every 6 (six) hours as needed.     medroxyPROGESTERone (DEPO-PROVERA) 150 MG/ML injection INJECT 1 ML INTRAMUSCULAR EVERY 3 MONTHS 90 DAYS 1 mL 3   medroxyPROGESTERone Acetate 150 MG/ML SUSY Inject 1 mL (150 mg total) into the muscle every 3 (three) months. 1 mL 1   No current facility-administered medications for this visit.    Medication Side Effects: none  Orders placed this visit:  No orders of the defined types were placed in this encounter.   Psychiatric Specialty Exam:  Review of Systems  Musculoskeletal:  Negative for gait problem.  Neurological:  Negative for tremors.  Psychiatric/Behavioral:         Please refer to HPI   Blood pressure (!) 146/98, pulse 91, height 5\' 9"  (1.753 m), weight 183 lb (83 kg).Body mass index is 27.02 kg/m.  General Appearance: Casual and Neat  Eye Contact:  Good  Speech:  Clear and Coherent  Volume:  Normal  Mood:  Euthymic  Affect:  Appropriate and Congruent  Thought Process:  Coherent and Descriptions of Associations: Intact  Orientation:  Full (Time, Place, and Person)  Thought Content: Logical   Suicidal Thoughts:  No  Homicidal Thoughts:  No  Memory:  WNL  Judgement:  Good  Insight:  Good  Psychomotor Activity:  Normal  Concentration:  Concentration: Good  Recall:  Good  Fund of Knowledge: Good  Language: Good  Assets:  Communication Skills Desire for Improvement Financial Resources/Insurance Housing Intimacy Leisure Time Physical Health Resilience Social Support Talents/Skills Transportation Vocational/Educational  ADL's:  Intact  Cognition: WNL  Prognosis:   Good   Screenings:  GAD-7    Flowsheet Row Video Visit from 03/07/2021 in [facility] Video Visit from 12/07/2020 in [facility] Video Visit from 09/11/2020 in [facility]  Total GAD-7 Score 2 3 3       PHQ2-9    Flowsheet Row Video Visit from 03/07/2021 in [facility] Video Visit from 12/07/2020 in [facility] Video Visit from 09/11/2020 in [facility] Office Visit from 11/27/2015 in [facility] Disease Office Visit from 07/26/2015 in [facility] for Infectious Disease  PHQ-2 Total Score 2 1 0 0 0  PHQ-9 Total Score 4 2 2  -- --      Flowsheet Row Video Visit from 12/07/2020 in [facility]  C-SSRS RISK CATEGORY No Risk       Receiving Psychotherapy: Yes   Treatment  Plan/Recommendations:  Plan:  PDMP reviewed  Stratera 40mg  daily - decrease to 20mg  daily x 7 days, then d/c. Prozac 40mg  daily Gabapentin 200 - 3 x a day to 300 TID Abilify 10mg  daily  RTC 3 months   Time spent with patient was 60 minutes. Greater than 50% of face to face time with patient was spent on counseling and coordination of care.    Patient advised to contact office with any questions, adverse effects, or acute worsening in signs and symptoms.   Discussed potential metabolic side effects associated with atypical antipsychotics, as well as potential risk for movement side effects. Advised pt to contact office if movement side effects occur.     , NP

## 2021-06-07 ENCOUNTER — Telehealth: Payer: Self-pay | Admitting: Adult Health

## 2021-06-07 NOTE — Telephone Encounter (Signed)
Pt called and is having a problem sleeping. It has gotten worse over the last few days. She would like to know if gina can prescribe something for her. Please give her a call at [phone]

## 2021-06-10 MED ORDER — QUETIAPINE FUMARATE 25 MG PO TABS: 25.0000 mg | ORAL_TABLET | Freq: Every day | ORAL | 0 refills | Status: DC

## 2021-06-10 NOTE — Telephone Encounter (Signed)
We could try Seroquel 25 mg - 1 to 2 at hs.

## 2021-06-10 NOTE — Telephone Encounter (Signed)
Patient is having trouble sleeping and is asking for something to help her sleep. She says she doesn't have any trouble going to sleep but she is waking up around 1-2 consistently and is up to about 4:30 and then has to get up around 6 to get her son ready for school. She has taken melatonin 10 mg without success. She states she is trying to go to bed the same time, no phone or TV close to bedtime. She is in intensive outpatient classes. There is some situational depression/anxiety. She states she takes her last dose of gabapentin closer to bedtime, but that is not helping with her sleep. She states that after a few days of this pattern she is able to sleep all night, she assumes from being so tired, but then the pattern starts all over.

## 2021-06-11 ENCOUNTER — Telehealth (HOSPITAL_COMMUNITY): Payer: No Payment, Other | Admitting: Psychiatry

## 2021-08-26 ENCOUNTER — Ambulatory Visit: Payer: 59 | Admitting: Adult Health

## 2021-08-26 ENCOUNTER — Encounter: Payer: Self-pay | Admitting: Adult Health

## 2021-08-26 NOTE — Progress Notes (Signed)
Patient no show appointment. ? ?

## 2021-11-08 ENCOUNTER — Telehealth: Payer: Self-pay | Admitting: Adult Health

## 2021-11-08 NOTE — Telephone Encounter (Signed)
Called and LVM for patient to return call - no answer. Advised to call back on Monday. Also left information about the after hours emergency line.

## 2021-11-08 NOTE — Telephone Encounter (Signed)
Chloe Mitchell called today at 1:30pm because Chloe Mitchell medication is not working well.  Chloe Mitchell has an appt 4/6 but was wondering if you could increase the dose of Chloe Mitchell abilify.  Chloe Mitchell is experiencing lots and lots of mania; up and down all the time.  Chloe Mitchell counselor thought perhaps the Abilify dose was low.  Chloe Mitchell just knows something needs to change and was hoping to make an adjustment before the appt and then could report on the change at the appt.  Pharmacy is [place] ?

## 2021-11-08 NOTE — Telephone Encounter (Signed)
Please see message. °

## 2021-11-14 ENCOUNTER — Encounter: Payer: Self-pay | Admitting: Adult Health

## 2021-11-14 ENCOUNTER — Ambulatory Visit (INDEPENDENT_AMBULATORY_CARE_PROVIDER_SITE_OTHER): Payer: 59 | Admitting: Adult Health

## 2021-11-14 DIAGNOSIS — F3160 Bipolar disorder, current episode mixed, unspecified: Secondary | ICD-10-CM | POA: Insufficient documentation

## 2021-11-14 DIAGNOSIS — F4010 Social phobia, unspecified: Secondary | ICD-10-CM | POA: Diagnosis not present

## 2021-11-14 DIAGNOSIS — F4310 Post-traumatic stress disorder, unspecified: Secondary | ICD-10-CM | POA: Diagnosis not present

## 2021-11-14 DIAGNOSIS — F3181 Bipolar II disorder: Secondary | ICD-10-CM | POA: Diagnosis not present

## 2021-11-14 DIAGNOSIS — F900 Attention-deficit hyperactivity disorder, predominantly inattentive type: Secondary | ICD-10-CM

## 2021-11-14 DIAGNOSIS — F411 Generalized anxiety disorder: Secondary | ICD-10-CM

## 2021-11-14 MED ORDER — ARIPIPRAZOLE 15 MG PO TABS: 15.0000 mg | ORAL_TABLET | Freq: Every day | ORAL | 2 refills | Status: DC

## 2021-11-14 NOTE — Progress Notes (Signed)
[name] ?[id number] ?[birth year] ?31 y.o. ? ?Subjective:  ? ?Patient ID:  Chloe Mitchell is a 31 y.o. (DOB 1990-12-26) female. ? ?Chief Complaint: No chief complaint on file. ? ? ?HPI ?Chloe Mitchell presents to the office today for follow-up of BPD-1, ADHD, SAD, and PTSD.  ? ?Describes mood today as "ok". Pleasant. Tearful at times. Mood symptoms - denies depression, anxiety, and irritability. Reports some mood fluctuations - more mania than down periods. Stating "I get going and make bad decisions with money". Has been paying her bills on time. Reports spending all of her income taxes - paid off some bills - and spent money things she didn't need. Has continued to stay away from Kratom and nicotine - has not had a desire. Partner drug tests her and keeps her accountable. Feels like she may need an increase in the Abilify for mood instability. Stable interest and motivation. Taking medications as prescribed.  ?Energy levels stable - sometimes elevated. Active, doe"s not have a regular exercise routine.  ?Enjoys some usual interests and activities. Lives with partner. Has a son - age 58. Spending time with family. ?Appetite adequate. Weight gain - 183.8 to 207 pounds. ?Sleeps well most nights. Averages 9 hours. ?Focus and concentration stable. Completing tasks. Managing aspects of household. Works as a Production designer, theatre/television/film at [employer]. ?Denies SI or HI.  ?Denies AH or VH. ? ?Attending couples counseling. ? ?Finished treatment at [facility]. Denies any Kratom use. ? ? ?Previous medication trials: Zoloft ?PHQ2-9   ? ?Flowsheet Row Office Visit from 11/27/2015 in [facility] for Infectious Disease Office Visit from 07/26/2015 in [facility] for Infectious Disease Office Visit from 03/21/2015 in [facility] for Infectious Disease  ?PHQ-2 Total Score 0 0 0  ? ?  ?  ? ?Review of Systems:  ?Review of Systems  ?Musculoskeletal:  Negative for gait problem.   ?Neurological:  Negative for tremors.  ?Psychiatric/Behavioral:    ?     Please refer to HPI  ? ?Medications: I have reviewed the patient's current medications. ? ?Current Outpatient Medications  ?Medication Sig Dispense Refill  ? ARIPiprazole (ABILIFY) 15 MG tablet Take 1 tablet (15 mg total) by mouth daily. 30 tablet 2  ? FLUoxetine (PROZAC) 40 MG capsule Take 1 capsule (40 mg total) by mouth daily. 90 capsule 3  ? gabapentin (NEURONTIN) 100 MG capsule Take 100-200 mg by mouth every 6 (six) hours as needed.    ? gabapentin (NEURONTIN) 300 MG capsule Take 1 capsule (300 mg total) by mouth 3 (three) times daily. 270 capsule 3  ? medroxyPROGESTERone (DEPO-PROVERA) 150 MG/ML injection INJECT 1 ML INTRAMUSCULAR EVERY 3 MONTHS 90 DAYS 1 mL 3  ? medroxyPROGESTERone Acetate 150 MG/ML SUSY Inject 1 mL (150 mg total) into the muscle every 3 (three) months. 1 mL 1  ? QUEtiapine (SEROQUEL) 25 MG tablet Take 1-2 tablets (25-50 mg total) by mouth at bedtime. 60 tablet 0  ? ?No current facility-administered medications for this visit.  ? ? ?Medication Side Effects: None ? ?Allergies: No Known Allergies ? ?No past medical history on file. ? ?Past Medical History, Surgical history, Social history, and Family history were reviewed and updated as appropriate.  ? ?Please see review of systems for further details on the patient's review from today.  ? ?Objective:  ? ?Physical Exam:  ?There were no vitals taken for this visit. ? ?Physical Exam ?Constitutional:   ?   General: She is not in  acute distress. ?Musculoskeletal:     ?   General: No deformity.  ?Neurological:  ?   Mental Status: She is alert and oriented to person, place, and time.  ?   Coordination: Coordination normal.  ?Psychiatric:     ?   Attention and Perception: Attention and perception normal. She does not perceive auditory or visual hallucinations.     ?   Mood and Affect: Mood normal. Mood is not anxious or depressed. Affect is not labile, blunt, angry or  inappropriate.     ?   Speech: Speech normal.     ?   Behavior: Behavior normal.     ?   Thought Content: Thought content normal. Thought content is not paranoid or delusional. Thought content does not include homicidal or suicidal ideation. Thought content does not include homicidal or suicidal plan.     ?   Cognition and Memory: Cognition and memory normal.     ?   Judgment: Judgment normal.  ?   Comments: Insight intact  ? ? ?Lab Review:  ?   ?Component Value Date/Time  ? NA 140 02/20/2015 1648  ? K 4.2 02/20/2015 1648  ? CL 104 02/20/2015 1648  ? CO2 23 02/20/2015 1648  ? GLUCOSE 117 (H) 02/20/2015 1648  ? BUN 14 02/20/2015 1648  ? CREATININE 0.63 02/20/2015 1648  ? CALCIUM 9.1 02/20/2015 1648  ? PROT 6.7 02/20/2015 1648  ? ALBUMIN 4.3 02/20/2015 1648  ? AST 62 (H) 02/20/2015 1648  ? ALT 94 (H) 02/20/2015 1648  ? ALKPHOS 47 02/20/2015 1648  ? BILITOT 0.5 02/20/2015 1648  ? ? ?   ?Component Value Date/Time  ? WBC 10.9 (H) 08/27/2015 1553  ? RBC 4.78 08/27/2015 1553  ? HGB 13.5 08/27/2015 1553  ? HCT 42.3 08/27/2015 1553  ? PLT 296 08/27/2015 1553  ? MCV 88.5 08/27/2015 1553  ? MCH 28.2 08/27/2015 1553  ? MCHC 31.9 08/27/2015 1553  ? RDW 13.2 08/27/2015 1553  ? LYMPHSABS 3.9 08/27/2015 1553  ? MONOABS 0.5 08/27/2015 1553  ? EOSABS 0.1 08/27/2015 1553  ? BASOSABS 0.0 08/27/2015 1553  ? ? ?No results found for: POCLITH, LITHIUM  ? ?No results found for: PHENYTOIN, PHENOBARB, VALPROATE, CBMZ  ? ?.res ?Assessment: Plan:   ? ?Plan: ? ?PDMP reviewed ? ?Increase Abilify 10mg  to 15mg  daily ?Prozac 40mg  daily ?Gabapentin 200 - 3 x a day to 300 TID ? ?RTC 3 months ? ?Time spent with patient was 25 minutes. Greater than 50% of face to face time with patient was spent on counseling and coordination of care.   ? ?Patient advised to contact office with any questions, adverse effects, or acute worsening in signs and symptoms. ?  ?Discussed potential metabolic side effects associated with atypical antipsychotics, as well as  potential risk for movement side effects. Advised pt to contact office if movement side effects occur.   ? ?Diagnoses and all orders for this visit: ? ?Attention deficit hyperactivity disorder (ADHD), predominantly inattentive type ? ?Bipolar II disorder (HCC) ?-     ARIPiprazole (ABILIFY) 15 MG tablet; Take 1 tablet (15 mg total) by mouth daily. ? ?Posttraumatic stress disorder ? ?Social anxiety disorder ? ?Generalized anxiety disorder ? ?  ? ?Please see After Visit Summary for patient specific instructions. ? ?Future Appointments  ?Date Time Provider Department Center  ?12/12/2021  3:00 PM Rosibel Giacobbe, , NP [facility] None  ? ? ?No orders of the defined types were placed in this encounter. ? ? ?------------------------------- ?

## 2021-12-12 ENCOUNTER — Ambulatory Visit: Payer: 59 | Admitting: Adult Health

## 2022-02-24 ENCOUNTER — Other Ambulatory Visit: Payer: Self-pay | Admitting: Adult Health

## 2022-02-24 DIAGNOSIS — F3181 Bipolar II disorder: Secondary | ICD-10-CM

## 2022-03-25 DIAGNOSIS — R69 Illness, unspecified: Secondary | ICD-10-CM | POA: Diagnosis not present

## 2022-03-31 DIAGNOSIS — R69 Illness, unspecified: Secondary | ICD-10-CM | POA: Diagnosis not present

## 2022-04-01 DIAGNOSIS — Z309 Encounter for contraceptive management, unspecified: Secondary | ICD-10-CM | POA: Diagnosis not present

## 2022-04-02 DIAGNOSIS — M722 Plantar fascial fibromatosis: Secondary | ICD-10-CM | POA: Diagnosis not present

## 2022-04-02 DIAGNOSIS — Z6833 Body mass index (BMI) 33.0-33.9, adult: Secondary | ICD-10-CM | POA: Diagnosis not present

## 2022-04-02 DIAGNOSIS — M25572 Pain in left ankle and joints of left foot: Secondary | ICD-10-CM | POA: Diagnosis not present

## 2022-04-03 ENCOUNTER — Ambulatory Visit (INDEPENDENT_AMBULATORY_CARE_PROVIDER_SITE_OTHER): Payer: 59

## 2022-04-03 ENCOUNTER — Encounter: Payer: Self-pay | Admitting: Emergency Medicine

## 2022-04-03 ENCOUNTER — Ambulatory Visit
Admission: EM | Admit: 2022-04-03 | Discharge: 2022-04-03 | Disposition: A | Discharge status: Home / Self Care | Payer: 59 | Attending: Urgent Care | Admitting: Urgent Care

## 2022-04-03 DIAGNOSIS — M722 Plantar fascial fibromatosis: Secondary | ICD-10-CM | POA: Diagnosis not present

## 2022-04-03 DIAGNOSIS — M25572 Pain in left ankle and joints of left foot: Secondary | ICD-10-CM

## 2022-04-03 DIAGNOSIS — S9002XA Contusion of left ankle, initial encounter: Secondary | ICD-10-CM

## 2022-04-03 MED ORDER — PREDNISONE 20 MG PO TABS: ORAL_TABLET | ORAL | 0 refills | Status: AC

## 2022-04-03 NOTE — ED Provider Notes (Signed)
[facility]   MRN: [id number] DOB: 1991-03-01  Subjective:   Chloe Mitchell is a 31 y.o. female presenting for 3 to 4-day history of acute onset persistent left ankle pain with bruising.  Patient bumped it against the door and has since had some pain, swelling and bruising.  It is improving.  She also has persistent left heel pain going on for about 3 months now.  She is worried about having plantar fasciitis.  She works at [employer] and has to do a lot of walking, standing for long periods of time.  No current facility-administered medications for this encounter.  Current Outpatient Medications:    ARIPiprazole (ABILIFY) 15 MG tablet, TAKE 1 TABLET (15 MG TOTAL) BY MOUTH DAILY., Disp: 30 tablet, Rfl: 2   FLUoxetine (PROZAC) 40 MG capsule, Take 1 capsule (40 mg total) by mouth daily., Disp: 90 capsule, Rfl: 3   gabapentin (NEURONTIN) 100 MG capsule, Take 100-200 mg by mouth every 6 (six) hours as needed., Disp: , Rfl:    gabapentin (NEURONTIN) 300 MG capsule, Take 1 capsule (300 mg total) by mouth 3 (three) times daily., Disp: 270 capsule, Rfl: 3   medroxyPROGESTERone (DEPO-PROVERA) 150 MG/ML injection, INJECT 1 ML INTRAMUSCULAR EVERY 3 MONTHS 90 DAYS, Disp: 1 mL, Rfl: 3   medroxyPROGESTERone Acetate 150 MG/ML SUSY, Inject 1 mL (150 mg total) into the muscle every 3 (three) months., Disp: 1 mL, Rfl: 1   QUEtiapine (SEROQUEL) 25 MG tablet, Take 1-2 tablets (25-50 mg total) by mouth at bedtime., Disp: 60 tablet, Rfl: 0   No Known Allergies  History reviewed. No pertinent past medical history.   History reviewed. No pertinent surgical history.  History reviewed. No pertinent family history.  Social History   Tobacco Use   Smoking status: Never   Smokeless tobacco: Never  Substance Use Topics   Alcohol use: No    Alcohol/week: 0.0 standard drinks of alcohol   Drug use: No    Comment: per patient clean for 2 years 2014    ROS   Objective:    Vitals: BP (!) 145/99   Pulse 96   Temp 98.2 F (36.8 C)   Resp 18   SpO2 98%   Physical Exam Constitutional:      General: She is not in acute distress.    Appearance: Normal appearance. She is well-developed. She is not ill-appearing, toxic-appearing or diaphoretic.  HENT:     Head: Normocephalic and atraumatic.     Nose: Nose normal.     Mouth/Throat:     Mouth: Mucous membranes are moist.  Eyes:     General: No scleral icterus.       Right eye: No discharge.        Left eye: No discharge.     Extraocular Movements: Extraocular movements intact.  Cardiovascular:     Rate and Rhythm: Normal rate.  Pulmonary:     Effort: Pulmonary effort is normal.  Musculoskeletal:     Left ankle: Swelling and ecchymosis present. No deformity or lacerations. Tenderness present over the medial malleolus (with associated ecchymosis). No lateral malleolus, ATF ligament, AITF ligament, CF ligament, posterior TF ligament, base of 5th metatarsal or proximal fibula tenderness. Decreased range of motion.     Left Achilles Tendon: No tenderness or defects. Thompson's test negative.     Left foot: Normal range of motion and normal capillary refill. Tenderness (over area outlined) present. No swelling, deformity, laceration, bony tenderness or crepitus.  Feet:  Skin:    General: Skin is warm and dry.  Neurological:     General: No focal deficit present.     Mental Status: She is alert and oriented to person, place, and time.  Psychiatric:        Mood and Affect: Mood normal.        Behavior: Behavior normal.        Thought Content: Thought content normal.        Judgment: Judgment normal.     DG Ankle Complete Left  Result Date: 04/03/2022 CLINICAL DATA:  Left ankle pain EXAM: LEFT ANKLE COMPLETE - 4 VIEW COMPARISON:  None Available. FINDINGS: No evidence of fracture, dislocation, or joint effusion. There is no evidence of arthropathy or other focal bone abnormality. Soft tissue  swelling, most pronounced at the medial malleolus. IMPRESSION: No acute osseous abnormality. Electronically Signed   By: Allegra Lai M.D.   On: 04/03/2022 15:00     Assessment and Plan :   PDMP not reviewed this encounter.  1. Contusion of left ankle, initial encounter   2. Plantar fasciitis of left foot   3. Acute left ankle pain     Recommended conservative management for left ankle contusion.  She also has a problem with plantar fasciitis and given the extent of it, severity recommended oral prednisone course.  I did place a referral for her to follow-up with podiatry soon as possible.  Counseled patient on potential for adverse effects with medications prescribed/recommended today, ER and return-to-clinic precautions discussed, patient verbalized understanding.    Wallis Bamberg, PA-C 04/03/22 1556

## 2022-04-03 NOTE — ED Triage Notes (Signed)
Pt here with burning left heel and ankle pain with bruising x 3 months. Pt states the heel pain started 3 months ago and ankle pain and bruising started last night.

## 2022-04-07 ENCOUNTER — Other Ambulatory Visit: Payer: Self-pay | Admitting: Podiatry

## 2022-04-07 ENCOUNTER — Ambulatory Visit (INDEPENDENT_AMBULATORY_CARE_PROVIDER_SITE_OTHER): Payer: 59 | Admitting: Podiatry

## 2022-04-07 ENCOUNTER — Ambulatory Visit (INDEPENDENT_AMBULATORY_CARE_PROVIDER_SITE_OTHER): Payer: 59

## 2022-04-07 ENCOUNTER — Encounter: Payer: Self-pay | Admitting: Podiatry

## 2022-04-07 DIAGNOSIS — M722 Plantar fascial fibromatosis: Secondary | ICD-10-CM

## 2022-04-07 DIAGNOSIS — M778 Other enthesopathies, not elsewhere classified: Secondary | ICD-10-CM

## 2022-04-07 MED ORDER — MELOXICAM 15 MG PO TABS: 15.0000 mg | ORAL_TABLET | Freq: Every day | ORAL | 3 refills | Status: AC

## 2022-04-07 MED ORDER — TRIAMCINOLONE ACETONIDE 40 MG/ML IJ SUSP
20.0000 mg | Freq: Once | INTRAMUSCULAR | Status: AC
  Administered 2022-04-07: 20 mg

## 2022-04-07 NOTE — Patient Instructions (Signed)

## 2022-04-07 NOTE — Progress Notes (Signed)
Subjective:  Patient ID: Chloe Mitchell, female    DOB: [birth year],  MRN: [id number] HPI Chief Complaint  Patient presents with   Foot Pain    Plantar heel left - aching x 3-4 weeks, AM pain, burning, hit medial ankle and is bruised-had checked at Urgent Care and told them about heel pain-was rx'd prednisone which did help, also been taking Advil and using ice   New Patient (Initial Visit)    31 y.o. female presents with the above complaint.   ROS: Denies fever chills nausea vomiting muscle aches pains calf pain back pain chest pain shortness of breath.  No past medical history on file. No past surgical history on file.  Current Outpatient Medications:    meloxicam (MOBIC) 15 MG tablet, Take 1 tablet (15 mg total) by mouth daily., Disp: 30 tablet, Rfl: 3   ARIPiprazole (ABILIFY) 15 MG tablet, TAKE 1 TABLET (15 MG TOTAL) BY MOUTH DAILY., Disp: 30 tablet, Rfl: 2   FLUoxetine (PROZAC) 40 MG capsule, Take 1 capsule (40 mg total) by mouth daily., Disp: 90 capsule, Rfl: 3   gabapentin (NEURONTIN) 100 MG capsule, Take 100-200 mg by mouth every 6 (six) hours as needed., Disp: , Rfl:    gabapentin (NEURONTIN) 300 MG capsule, Take 1 capsule (300 mg total) by mouth 3 (three) times daily., Disp: 270 capsule, Rfl: 3   medroxyPROGESTERone (DEPO-PROVERA) 150 MG/ML injection, INJECT 1 ML INTRAMUSCULAR EVERY 3 MONTHS 90 DAYS, Disp: 1 mL, Rfl: 3   medroxyPROGESTERone Acetate 150 MG/ML SUSY, Inject 1 mL (150 mg total) into the muscle every 3 (three) months., Disp: 1 mL, Rfl: 1   montelukast (SINGULAIR) 10 MG tablet, Take 10 mg by mouth daily., Disp: , Rfl:    predniSONE (DELTASONE) 20 MG tablet, Take 2 tablets daily with breakfast., Disp: 10 tablet, Rfl: 0   QUEtiapine (SEROQUEL) 25 MG tablet, Take 1-2 tablets (25-50 mg total) by mouth at bedtime., Disp: 60 tablet, Rfl: 0  No Known Allergies Review of Systems Objective:  There were no vitals filed for this visit.  General: Well developed,  nourished, in no acute distress, alert and oriented x3   Dermatological: Skin is warm, dry and supple bilateral. Nails x 10 are well maintained; remaining integument appears unremarkable at this time. There are no open sores, no preulcerative lesions, no rash or signs of infection present.  Vascular: Dorsalis Pedis artery and Posterior Tibial artery pedal pulses are 2/4 bilateral with immedate capillary fill time. Pedal hair growth present. No varicosities and no lower extremity edema present bilateral.   Neruologic: Grossly intact via light touch bilateral. Vibratory intact via tuning fork bilateral. Protective threshold with Semmes Wienstein monofilament intact to all pedal sites bilateral. Patellar and Achilles deep tendon reflexes 2+ bilateral. No Babinski or clonus noted bilateral.   Musculoskeletal: No gross boney pedal deformities bilateral. No pain, crepitus, or limitation noted with foot and ankle range of motion bilateral. Muscular strength 5/5 in all groups tested bilateral.  Pain on palpation medial calcaneal tubercle of the bilateral heels left greater than right  Gait: Unassisted, Nonantalgic.    Radiographs:  Radiographs taken today demonstrate an osseously mature individual soft tissue increase in density plantar fascial Caney insertion site of the left heel.  Assessment & Plan:   Assessment: Planter fasciitis left greater than right  Plan: Discussed etiology pathology conservative surgical therapies at this point I performed injection to the left heel 20 mg Kenalog 5 mg Marcaine point of maximal tenderness placed her on an  oral anti-inflammatory also placed in a plantar fascial brace.  Discussed appropriate shoe gear stretching exercises ice therapy and shoe gear modifications.  I will follow-up with her in 1 month     Pleas Carneal T. [place], [place]

## 2022-04-15 DIAGNOSIS — R69 Illness, unspecified: Secondary | ICD-10-CM | POA: Diagnosis not present

## 2022-04-21 DIAGNOSIS — R69 Illness, unspecified: Secondary | ICD-10-CM | POA: Diagnosis not present

## 2022-05-02 DIAGNOSIS — R69 Illness, unspecified: Secondary | ICD-10-CM | POA: Diagnosis not present

## 2022-05-05 DIAGNOSIS — R69 Illness, unspecified: Secondary | ICD-10-CM | POA: Diagnosis not present

## 2022-05-07 ENCOUNTER — Encounter: Payer: 59 | Admitting: Podiatry

## 2022-05-12 DIAGNOSIS — R69 Illness, unspecified: Secondary | ICD-10-CM | POA: Diagnosis not present

## 2022-05-19 DIAGNOSIS — R69 Illness, unspecified: Secondary | ICD-10-CM | POA: Diagnosis not present

## 2022-05-26 DIAGNOSIS — R69 Illness, unspecified: Secondary | ICD-10-CM | POA: Diagnosis not present

## 2022-05-28 ENCOUNTER — Other Ambulatory Visit: Payer: Self-pay | Admitting: Adult Health

## 2022-05-28 DIAGNOSIS — F3181 Bipolar II disorder: Secondary | ICD-10-CM

## 2022-05-28 NOTE — Telephone Encounter (Signed)
Please schedule appt

## 2022-06-02 DIAGNOSIS — R69 Illness, unspecified: Secondary | ICD-10-CM | POA: Diagnosis not present

## 2022-06-03 NOTE — Telephone Encounter (Signed)
Pt is scheduled for tomorrow 

## 2022-06-04 ENCOUNTER — Ambulatory Visit (INDEPENDENT_AMBULATORY_CARE_PROVIDER_SITE_OTHER): Payer: 59 | Admitting: Adult Health

## 2022-06-04 ENCOUNTER — Encounter: Payer: Self-pay | Admitting: Adult Health

## 2022-06-04 DIAGNOSIS — F411 Generalized anxiety disorder: Secondary | ICD-10-CM | POA: Diagnosis not present

## 2022-06-04 DIAGNOSIS — R69 Illness, unspecified: Secondary | ICD-10-CM | POA: Diagnosis not present

## 2022-06-04 DIAGNOSIS — F3181 Bipolar II disorder: Secondary | ICD-10-CM | POA: Diagnosis not present

## 2022-06-04 MED ORDER — FLUOXETINE HCL 40 MG PO CAPS: 40.0000 mg | ORAL_CAPSULE | Freq: Every day | ORAL | 3 refills | Status: AC

## 2022-06-04 MED ORDER — ARIPIPRAZOLE 15 MG PO TABS: 15.0000 mg | ORAL_TABLET | Freq: Every day | ORAL | 3 refills | Status: AC

## 2022-06-04 MED ORDER — GABAPENTIN 300 MG PO CAPS: 300.0000 mg | ORAL_CAPSULE | Freq: Three times a day (TID) | ORAL | 3 refills | Status: AC

## 2022-06-04 NOTE — Progress Notes (Signed)
Chloe Mitchell [id number] [birth year] 31 y.o.  Subjective:   Patient ID:  Chloe Mitchell is a 31 y.o. (DOB 1990/11/19) female.  Chief Complaint: No chief complaint on file.   HPI Chloe Mitchell presents to the office today for follow-up of BPD-1 and GAD.  Describes mood today as "ok". Pleasant. Tearful at times. Mood symptoms - denies depression, anxiety and irritability. Mood is consistent. Stating "I feel like I'm doing alright". Feels like medications are working well. Stable interest and motivation. Taking medications as prescribed.  Energy levels stable. Active, does not have a regular exercise routine.  Enjoys some usual interests and activities. Lives with partner. Attending couples counseling. Has a son - age 43 - 27th grader. Spending time with family. Appetite adequate. Weight stable - 207 pounds. Sleeps well most nights. Averages 8 hours. Focus and concentration stable. Completing tasks. Managing aspects of household. Works at [employer], [employer].   Denies SI or HI.  Denies AH or VH.  [place] Office Visit from 11/27/2015 in [facility] for Infectious Disease Office Visit from 07/26/2015 in [facility] for Infectious Disease Office Visit from 03/21/2015 in [facility] for Infectious Disease  PHQ-2 Total Score 0 0 0      [place] [facility] from 04/03/2022 in [facility] [place] No Risk       Review of Systems:  Review of Systems  Musculoskeletal:  Negative for gait problem.  Neurological:  Negative for tremors.  Psychiatric/Behavioral:         Please refer to HPI    Medications: I have reviewed the patient's current medications.  Current Outpatient Medications  Medication Sig Dispense Refill   ARIPiprazole (ABILIFY) 15 MG tablet Take 1 tablet (15 mg total) by mouth daily. 90 tablet 3   FLUoxetine (PROZAC) 40 MG capsule Take 1 capsule (40 mg total) by  mouth daily. 90 capsule 3   gabapentin (NEURONTIN) 300 MG capsule Take 1 capsule (300 mg total) by mouth 3 (three) times daily. 270 capsule 3   medroxyPROGESTERone (DEPO-PROVERA) 150 MG/ML injection INJECT 1 ML INTRAMUSCULAR EVERY 3 MONTHS 90 DAYS 1 mL 3   medroxyPROGESTERone Acetate 150 MG/ML SUSY Inject 1 mL (150 mg total) into the muscle every 3 (three) months. 1 mL 1   meloxicam (MOBIC) 15 MG tablet Take 1 tablet (15 mg total) by mouth daily. 30 tablet 3   montelukast (SINGULAIR) 10 MG tablet Take 10 mg by mouth daily.     predniSONE (DELTASONE) 20 MG tablet Take 2 tablets daily with breakfast. 10 tablet 0   No current facility-administered medications for this visit.    Medication Side Effects: None  Allergies: No Known Allergies  No past medical history on file.  Past Medical History, Surgical history, Social history, and Family history were reviewed and updated as appropriate.   Please see review of systems for further details on the patient's review from today.   Objective:   Physical Exam:  There were no vitals taken for this visit.  Physical Exam Constitutional:      General: She is not in acute distress. Musculoskeletal:        General: No deformity.  Neurological:     Mental Status: She is alert and oriented to person, place, and time.     Coordination: Coordination normal.  Psychiatric:        Attention and Perception: Attention and perception normal.  She does not perceive auditory or visual hallucinations.        Mood and Affect: Mood normal. Mood is not anxious or depressed. Affect is not labile, blunt, angry or inappropriate.        Speech: Speech normal.        Behavior: Behavior normal.        Thought Content: Thought content normal. Thought content is not paranoid or delusional. Thought content does not include homicidal or suicidal ideation. Thought content does not include homicidal or suicidal plan.        Cognition and Memory: Cognition and memory  normal.        Judgment: Judgment normal.     Comments: Insight intact     Lab Review:     Component Value Date/Time   NA 140 02/20/2015 1648   K 4.2 02/20/2015 1648   CL 104 02/20/2015 1648   CO2 23 02/20/2015 1648   GLUCOSE 117 (H) 02/20/2015 1648   BUN 14 02/20/2015 1648   CREATININE 0.63 02/20/2015 1648   CALCIUM 9.1 02/20/2015 1648   PROT 6.7 02/20/2015 1648   ALBUMIN 4.3 02/20/2015 1648   AST 62 (H) 02/20/2015 1648   ALT 94 (H) 02/20/2015 1648   ALKPHOS 47 02/20/2015 1648   BILITOT 0.5 02/20/2015 1648       Component Value Date/Time   WBC 10.9 (H) 08/27/2015 1553   RBC 4.78 08/27/2015 1553   HGB 13.5 08/27/2015 1553   HCT 42.3 08/27/2015 1553   PLT 296 08/27/2015 1553   MCV 88.5 08/27/2015 1553   MCH 28.2 08/27/2015 1553   MCHC 31.9 08/27/2015 1553   RDW 13.2 08/27/2015 1553   LYMPHSABS 3.9 08/27/2015 1553   MONOABS 0.5 08/27/2015 1553   EOSABS 0.1 08/27/2015 1553   BASOSABS 0.0 08/27/2015 1553    No results found for: "POCLITH", "LITHIUM"   No results found for: "PHENYTOIN", "PHENOBARB", "VALPROATE", "CBMZ"   .res Assessment: Plan:    Plan:  PDMP reviewed  Abilify 15mg  daily Prozac 40mg  daily Gabapentin 300 TID  RTC 6 months  Time spent with patient was 15 minutes. Greater than 50% of face to face time with patient was spent on counseling and coordination of care.    Patient advised to contact office with any questions, adverse effects, or acute worsening in signs and symptoms.   Discussed potential metabolic side effects associated with atypical antipsychotics, as well as potential risk for movement side effects. Advised pt to contact office if movement side effects occur.     Diagnoses and all orders for this visit:  Bipolar II disorder (HCC) -     ARIPiprazole (ABILIFY) 15 MG tablet; Take 1 tablet (15 mg total) by mouth daily. -     FLUoxetine (PROZAC) 40 MG capsule; Take 1 capsule (40 mg total) by mouth daily.  Generalized anxiety  disorder -     FLUoxetine (PROZAC) 40 MG capsule; Take 1 capsule (40 mg total) by mouth daily. -     gabapentin (NEURONTIN) 300 MG capsule; Take 1 capsule (300 mg total) by mouth 3 (three) times daily.     Please see After Visit Summary for patient specific instructions.  No future appointments.  No orders of the defined types were placed in this encounter.   -------------------------------

## 2022-06-09 DIAGNOSIS — R69 Illness, unspecified: Secondary | ICD-10-CM | POA: Diagnosis not present

## 2022-06-16 DIAGNOSIS — R69 Illness, unspecified: Secondary | ICD-10-CM | POA: Diagnosis not present

## 2022-06-23 DIAGNOSIS — R69 Illness, unspecified: Secondary | ICD-10-CM | POA: Diagnosis not present

## 2022-06-30 DIAGNOSIS — R69 Illness, unspecified: Secondary | ICD-10-CM | POA: Diagnosis not present

## 2022-06-30 DIAGNOSIS — Z3042 Encounter for surveillance of injectable contraceptive: Secondary | ICD-10-CM | POA: Diagnosis not present

## 2022-07-07 DIAGNOSIS — R69 Illness, unspecified: Secondary | ICD-10-CM | POA: Diagnosis not present

## 2022-07-10 DIAGNOSIS — Z87891 Personal history of nicotine dependence: Secondary | ICD-10-CM | POA: Diagnosis not present

## 2022-07-10 DIAGNOSIS — Z833 Family history of diabetes mellitus: Secondary | ICD-10-CM | POA: Diagnosis not present

## 2022-07-10 DIAGNOSIS — G8929 Other chronic pain: Secondary | ICD-10-CM | POA: Diagnosis not present

## 2022-07-10 DIAGNOSIS — J309 Allergic rhinitis, unspecified: Secondary | ICD-10-CM | POA: Diagnosis not present

## 2022-07-10 DIAGNOSIS — Z809 Family history of malignant neoplasm, unspecified: Secondary | ICD-10-CM | POA: Diagnosis not present

## 2022-07-10 DIAGNOSIS — R69 Illness, unspecified: Secondary | ICD-10-CM | POA: Diagnosis not present

## 2022-07-10 DIAGNOSIS — R030 Elevated blood-pressure reading, without diagnosis of hypertension: Secondary | ICD-10-CM | POA: Diagnosis not present

## 2022-07-10 DIAGNOSIS — Z811 Family history of alcohol abuse and dependence: Secondary | ICD-10-CM | POA: Diagnosis not present

## 2022-07-10 DIAGNOSIS — Z8249 Family history of ischemic heart disease and other diseases of the circulatory system: Secondary | ICD-10-CM | POA: Diagnosis not present

## 2022-07-10 DIAGNOSIS — Z791 Long term (current) use of non-steroidal anti-inflammatories (NSAID): Secondary | ICD-10-CM | POA: Diagnosis not present

## 2022-07-10 DIAGNOSIS — Z818 Family history of other mental and behavioral disorders: Secondary | ICD-10-CM | POA: Diagnosis not present

## 2022-07-10 DIAGNOSIS — E669 Obesity, unspecified: Secondary | ICD-10-CM | POA: Diagnosis not present

## 2022-07-14 DIAGNOSIS — R69 Illness, unspecified: Secondary | ICD-10-CM | POA: Diagnosis not present

## 2022-07-21 DIAGNOSIS — R69 Illness, unspecified: Secondary | ICD-10-CM | POA: Diagnosis not present

## 2022-07-28 DIAGNOSIS — R69 Illness, unspecified: Secondary | ICD-10-CM | POA: Diagnosis not present

## 2022-08-13 DIAGNOSIS — R69 Illness, unspecified: Secondary | ICD-10-CM | POA: Diagnosis not present

## 2022-08-20 DIAGNOSIS — R69 Illness, unspecified: Secondary | ICD-10-CM | POA: Diagnosis not present

## 2022-08-26 DIAGNOSIS — R69 Illness, unspecified: Secondary | ICD-10-CM | POA: Diagnosis not present

## 2022-09-01 DIAGNOSIS — R69 Illness, unspecified: Secondary | ICD-10-CM | POA: Diagnosis not present

## 2022-09-06 DIAGNOSIS — R69 Illness, unspecified: Secondary | ICD-10-CM | POA: Diagnosis not present

## 2022-09-11 DIAGNOSIS — R69 Illness, unspecified: Secondary | ICD-10-CM | POA: Diagnosis not present

## 2022-09-20 DIAGNOSIS — R69 Illness, unspecified: Secondary | ICD-10-CM | POA: Diagnosis not present

## 2022-09-23 DIAGNOSIS — Z309 Encounter for contraceptive management, unspecified: Secondary | ICD-10-CM | POA: Diagnosis not present

## 2022-09-23 DIAGNOSIS — Z6834 Body mass index (BMI) 34.0-34.9, adult: Secondary | ICD-10-CM | POA: Diagnosis not present

## 2022-09-24 DIAGNOSIS — Z3009 Encounter for other general counseling and advice on contraception: Secondary | ICD-10-CM | POA: Diagnosis not present

## 2022-09-24 DIAGNOSIS — E1169 Type 2 diabetes mellitus with other specified complication: Secondary | ICD-10-CM | POA: Diagnosis not present

## 2022-09-24 DIAGNOSIS — R69 Illness, unspecified: Secondary | ICD-10-CM | POA: Diagnosis not present

## 2022-09-29 DIAGNOSIS — R69 Illness, unspecified: Secondary | ICD-10-CM | POA: Diagnosis not present

## 2022-10-04 DIAGNOSIS — F411 Generalized anxiety disorder: Secondary | ICD-10-CM | POA: Diagnosis not present

## 2022-10-06 DIAGNOSIS — R69 Illness, unspecified: Secondary | ICD-10-CM | POA: Diagnosis not present

## 2022-10-07 DIAGNOSIS — Z30430 Encounter for insertion of intrauterine contraceptive device: Secondary | ICD-10-CM | POA: Diagnosis not present

## 2022-10-07 DIAGNOSIS — Z3202 Encounter for pregnancy test, result negative: Secondary | ICD-10-CM | POA: Diagnosis not present

## 2022-10-13 DIAGNOSIS — F1191 Opioid use, unspecified, in remission: Secondary | ICD-10-CM | POA: Diagnosis not present

## 2022-10-13 DIAGNOSIS — F3112 Bipolar disorder, current episode manic without psychotic features, moderate: Secondary | ICD-10-CM | POA: Diagnosis not present

## 2022-10-14 DIAGNOSIS — Z6834 Body mass index (BMI) 34.0-34.9, adult: Secondary | ICD-10-CM | POA: Diagnosis not present

## 2022-10-14 DIAGNOSIS — Z0000 Encounter for general adult medical examination without abnormal findings: Secondary | ICD-10-CM | POA: Diagnosis not present

## 2022-10-14 DIAGNOSIS — E1169 Type 2 diabetes mellitus with other specified complication: Secondary | ICD-10-CM | POA: Diagnosis not present

## 2022-10-31 DIAGNOSIS — F1191 Opioid use, unspecified, in remission: Secondary | ICD-10-CM | POA: Diagnosis not present

## 2022-10-31 DIAGNOSIS — F3112 Bipolar disorder, current episode manic without psychotic features, moderate: Secondary | ICD-10-CM | POA: Diagnosis not present

## 2022-11-12 ENCOUNTER — Telehealth: Payer: Self-pay | Admitting: Adult Health

## 2022-11-12 NOTE — Telephone Encounter (Signed)
Received Staff Health Assessment Form. Given to [facility] 4/3

## 2022-11-13 NOTE — Telephone Encounter (Signed)
Form is asking about pt's mental/ emotional condition, she has not been seen since 05/2022, next apt is 04/24.   Okay to give clearance or do you need to see her to confirm stability?

## 2022-11-13 NOTE — Telephone Encounter (Signed)
Noted thank you

## 2022-11-13 NOTE — Telephone Encounter (Signed)
Will need an appointment to discuss 

## 2022-11-14 ENCOUNTER — Inpatient Hospital Stay: Admission: RE | Admit: 2022-11-14 | Payer: Self-pay | Source: Ambulatory Visit

## 2022-11-14 DIAGNOSIS — F1191 Opioid use, unspecified, in remission: Secondary | ICD-10-CM | POA: Diagnosis not present

## 2022-11-14 DIAGNOSIS — F3112 Bipolar disorder, current episode manic without psychotic features, moderate: Secondary | ICD-10-CM | POA: Diagnosis not present

## 2022-11-14 DIAGNOSIS — Z111 Encounter for screening for respiratory tuberculosis: Secondary | ICD-10-CM | POA: Diagnosis not present

## 2022-11-15 DIAGNOSIS — F411 Generalized anxiety disorder: Secondary | ICD-10-CM | POA: Diagnosis not present

## 2022-12-02 DIAGNOSIS — K921 Melena: Secondary | ICD-10-CM | POA: Diagnosis not present

## 2022-12-02 DIAGNOSIS — Z6835 Body mass index (BMI) 35.0-35.9, adult: Secondary | ICD-10-CM | POA: Diagnosis not present

## 2022-12-03 ENCOUNTER — Ambulatory Visit: Payer: Self-pay | Admitting: Adult Health

## 2022-12-03 DIAGNOSIS — K921 Melena: Secondary | ICD-10-CM | POA: Diagnosis not present

## 2022-12-12 DIAGNOSIS — F3112 Bipolar disorder, current episode manic without psychotic features, moderate: Secondary | ICD-10-CM | POA: Diagnosis not present

## 2022-12-12 DIAGNOSIS — F1191 Opioid use, unspecified, in remission: Secondary | ICD-10-CM | POA: Diagnosis not present

## 2022-12-13 DIAGNOSIS — F411 Generalized anxiety disorder: Secondary | ICD-10-CM | POA: Diagnosis not present

## 2022-12-24 DIAGNOSIS — Z30431 Encounter for routine checking of intrauterine contraceptive device: Secondary | ICD-10-CM | POA: Diagnosis not present

## 2022-12-26 ENCOUNTER — Ambulatory Visit: Payer: Self-pay | Admitting: Adult Health

## 2022-12-26 DIAGNOSIS — F5089 Other specified eating disorder: Secondary | ICD-10-CM | POA: Diagnosis not present

## 2022-12-26 DIAGNOSIS — F1521 Other stimulant dependence, in remission: Secondary | ICD-10-CM | POA: Diagnosis not present

## 2022-12-27 DIAGNOSIS — F411 Generalized anxiety disorder: Secondary | ICD-10-CM | POA: Diagnosis not present

## 2023-01-09 DIAGNOSIS — F1191 Opioid use, unspecified, in remission: Secondary | ICD-10-CM | POA: Diagnosis not present

## 2023-01-09 DIAGNOSIS — F3112 Bipolar disorder, current episode manic without psychotic features, moderate: Secondary | ICD-10-CM | POA: Diagnosis not present

## 2023-01-16 ENCOUNTER — Ambulatory Visit: Payer: Self-pay | Admitting: Adult Health

## 2023-01-16 DIAGNOSIS — F3112 Bipolar disorder, current episode manic without psychotic features, moderate: Secondary | ICD-10-CM | POA: Diagnosis not present

## 2023-01-16 DIAGNOSIS — F1191 Opioid use, unspecified, in remission: Secondary | ICD-10-CM | POA: Diagnosis not present

## 2023-01-24 DIAGNOSIS — F411 Generalized anxiety disorder: Secondary | ICD-10-CM | POA: Diagnosis not present

## 2023-01-25 DIAGNOSIS — Z20828 Contact with and (suspected) exposure to other viral communicable diseases: Secondary | ICD-10-CM | POA: Diagnosis not present

## 2023-01-25 DIAGNOSIS — J111 Influenza due to unidentified influenza virus with other respiratory manifestations: Secondary | ICD-10-CM | POA: Diagnosis not present

## 2023-01-25 DIAGNOSIS — J029 Acute pharyngitis, unspecified: Secondary | ICD-10-CM | POA: Diagnosis not present

## 2023-01-25 DIAGNOSIS — R0981 Nasal congestion: Secondary | ICD-10-CM | POA: Diagnosis not present

## 2023-01-25 DIAGNOSIS — R051 Acute cough: Secondary | ICD-10-CM | POA: Diagnosis not present

## 2023-01-30 ENCOUNTER — Ambulatory Visit: Payer: Self-pay | Admitting: Adult Health

## 2023-01-30 DIAGNOSIS — E1169 Type 2 diabetes mellitus with other specified complication: Secondary | ICD-10-CM | POA: Diagnosis not present

## 2023-01-30 DIAGNOSIS — F1191 Opioid use, unspecified, in remission: Secondary | ICD-10-CM | POA: Diagnosis not present

## 2023-01-30 DIAGNOSIS — F3112 Bipolar disorder, current episode manic without psychotic features, moderate: Secondary | ICD-10-CM | POA: Diagnosis not present

## 2023-02-20 DIAGNOSIS — F1191 Opioid use, unspecified, in remission: Secondary | ICD-10-CM | POA: Diagnosis not present

## 2023-02-20 DIAGNOSIS — F3112 Bipolar disorder, current episode manic without psychotic features, moderate: Secondary | ICD-10-CM | POA: Diagnosis not present

## 2023-02-21 DIAGNOSIS — F3181 Bipolar II disorder: Secondary | ICD-10-CM | POA: Diagnosis not present

## 2023-02-21 DIAGNOSIS — F419 Anxiety disorder, unspecified: Secondary | ICD-10-CM | POA: Diagnosis not present

## 2023-02-27 DIAGNOSIS — F3112 Bipolar disorder, current episode manic without psychotic features, moderate: Secondary | ICD-10-CM | POA: Diagnosis not present

## 2023-02-27 DIAGNOSIS — F1191 Opioid use, unspecified, in remission: Secondary | ICD-10-CM | POA: Diagnosis not present

## 2023-03-07 DIAGNOSIS — F3181 Bipolar II disorder: Secondary | ICD-10-CM | POA: Diagnosis not present

## 2023-03-07 DIAGNOSIS — F419 Anxiety disorder, unspecified: Secondary | ICD-10-CM | POA: Diagnosis not present

## 2023-03-13 ENCOUNTER — Ambulatory Visit: Payer: Self-pay | Admitting: Adult Health

## 2023-03-16 DIAGNOSIS — F3132 Bipolar disorder, current episode depressed, moderate: Secondary | ICD-10-CM | POA: Diagnosis not present

## 2023-03-16 DIAGNOSIS — F411 Generalized anxiety disorder: Secondary | ICD-10-CM | POA: Diagnosis not present

## 2023-03-20 DIAGNOSIS — F1191 Opioid use, unspecified, in remission: Secondary | ICD-10-CM | POA: Diagnosis not present

## 2023-03-20 DIAGNOSIS — F3112 Bipolar disorder, current episode manic without psychotic features, moderate: Secondary | ICD-10-CM | POA: Diagnosis not present

## 2023-04-10 DIAGNOSIS — F3112 Bipolar disorder, current episode manic without psychotic features, moderate: Secondary | ICD-10-CM | POA: Diagnosis not present

## 2023-04-10 DIAGNOSIS — F1191 Opioid use, unspecified, in remission: Secondary | ICD-10-CM | POA: Diagnosis not present

## 2023-04-21 DIAGNOSIS — F3132 Bipolar disorder, current episode depressed, moderate: Secondary | ICD-10-CM | POA: Diagnosis not present

## 2023-04-21 DIAGNOSIS — F411 Generalized anxiety disorder: Secondary | ICD-10-CM | POA: Diagnosis not present

## 2023-04-22 DIAGNOSIS — F3112 Bipolar disorder, current episode manic without psychotic features, moderate: Secondary | ICD-10-CM | POA: Diagnosis not present

## 2023-04-22 DIAGNOSIS — F1191 Opioid use, unspecified, in remission: Secondary | ICD-10-CM | POA: Diagnosis not present

## 2023-04-30 DIAGNOSIS — E119 Type 2 diabetes mellitus without complications: Secondary | ICD-10-CM | POA: Diagnosis not present

## 2023-04-30 DIAGNOSIS — F3160 Bipolar disorder, current episode mixed, unspecified: Secondary | ICD-10-CM | POA: Diagnosis not present

## 2023-04-30 DIAGNOSIS — Z6834 Body mass index (BMI) 34.0-34.9, adult: Secondary | ICD-10-CM | POA: Diagnosis not present

## 2023-05-14 DIAGNOSIS — F1191 Opioid use, unspecified, in remission: Secondary | ICD-10-CM | POA: Diagnosis not present

## 2023-05-14 DIAGNOSIS — F3112 Bipolar disorder, current episode manic without psychotic features, moderate: Secondary | ICD-10-CM | POA: Diagnosis not present

## 2023-05-19 DIAGNOSIS — F411 Generalized anxiety disorder: Secondary | ICD-10-CM | POA: Diagnosis not present

## 2023-05-19 DIAGNOSIS — F3132 Bipolar disorder, current episode depressed, moderate: Secondary | ICD-10-CM | POA: Diagnosis not present

## 2023-05-26 DIAGNOSIS — F1111 Opioid abuse, in remission: Secondary | ICD-10-CM | POA: Diagnosis not present

## 2023-05-26 DIAGNOSIS — Z6834 Body mass index (BMI) 34.0-34.9, adult: Secondary | ICD-10-CM | POA: Diagnosis not present

## 2023-05-26 DIAGNOSIS — H938X3 Other specified disorders of ear, bilateral: Secondary | ICD-10-CM | POA: Diagnosis not present

## 2023-05-26 DIAGNOSIS — F429 Obsessive-compulsive disorder, unspecified: Secondary | ICD-10-CM | POA: Diagnosis not present

## 2023-05-28 DIAGNOSIS — F1191 Opioid use, unspecified, in remission: Secondary | ICD-10-CM | POA: Diagnosis not present

## 2023-05-28 DIAGNOSIS — F3112 Bipolar disorder, current episode manic without psychotic features, moderate: Secondary | ICD-10-CM | POA: Diagnosis not present

## 2023-06-02 DIAGNOSIS — F1111 Opioid abuse, in remission: Secondary | ICD-10-CM | POA: Diagnosis not present

## 2023-06-02 DIAGNOSIS — F429 Obsessive-compulsive disorder, unspecified: Secondary | ICD-10-CM | POA: Diagnosis not present

## 2023-06-09 DIAGNOSIS — F429 Obsessive-compulsive disorder, unspecified: Secondary | ICD-10-CM | POA: Diagnosis not present

## 2023-06-09 DIAGNOSIS — F1111 Opioid abuse, in remission: Secondary | ICD-10-CM | POA: Diagnosis not present

## 2023-06-16 DIAGNOSIS — F429 Obsessive-compulsive disorder, unspecified: Secondary | ICD-10-CM | POA: Diagnosis not present

## 2023-06-16 DIAGNOSIS — F1111 Opioid abuse, in remission: Secondary | ICD-10-CM | POA: Diagnosis not present

## 2023-06-18 DIAGNOSIS — R7611 Nonspecific reaction to tuberculin skin test without active tuberculosis: Secondary | ICD-10-CM | POA: Diagnosis not present

## 2023-07-01 DIAGNOSIS — R7401 Elevation of levels of liver transaminase levels: Secondary | ICD-10-CM | POA: Diagnosis not present

## 2023-07-03 DIAGNOSIS — Z6834 Body mass index (BMI) 34.0-34.9, adult: Secondary | ICD-10-CM | POA: Diagnosis not present

## 2023-07-03 DIAGNOSIS — E119 Type 2 diabetes mellitus without complications: Secondary | ICD-10-CM | POA: Diagnosis not present

## 2023-07-03 DIAGNOSIS — F3160 Bipolar disorder, current episode mixed, unspecified: Secondary | ICD-10-CM | POA: Diagnosis not present

## 2023-08-11 DIAGNOSIS — F411 Generalized anxiety disorder: Secondary | ICD-10-CM | POA: Diagnosis not present

## 2023-08-11 DIAGNOSIS — F3132 Bipolar disorder, current episode depressed, moderate: Secondary | ICD-10-CM | POA: Diagnosis not present

## 2023-08-14 DIAGNOSIS — F1191 Opioid use, unspecified, in remission: Secondary | ICD-10-CM | POA: Diagnosis not present

## 2023-08-14 DIAGNOSIS — N921 Excessive and frequent menstruation with irregular cycle: Secondary | ICD-10-CM | POA: Diagnosis not present

## 2023-08-14 DIAGNOSIS — Z30431 Encounter for routine checking of intrauterine contraceptive device: Secondary | ICD-10-CM | POA: Diagnosis not present

## 2023-08-14 DIAGNOSIS — F3112 Bipolar disorder, current episode manic without psychotic features, moderate: Secondary | ICD-10-CM | POA: Diagnosis not present

## 2023-09-08 DIAGNOSIS — Z6833 Body mass index (BMI) 33.0-33.9, adult: Secondary | ICD-10-CM | POA: Diagnosis not present

## 2023-09-08 DIAGNOSIS — E119 Type 2 diabetes mellitus without complications: Secondary | ICD-10-CM | POA: Diagnosis not present

## 2023-09-08 DIAGNOSIS — R945 Abnormal results of liver function studies: Secondary | ICD-10-CM | POA: Diagnosis not present

## 2023-09-08 DIAGNOSIS — R7401 Elevation of levels of liver transaminase levels: Secondary | ICD-10-CM | POA: Diagnosis not present

## 2024-06-27 ENCOUNTER — Other Ambulatory Visit: Payer: Self-pay

## 2024-06-27 ENCOUNTER — Encounter (HOSPITAL_BASED_OUTPATIENT_CLINIC_OR_DEPARTMENT_OTHER): Payer: Self-pay | Admitting: Emergency Medicine

## 2024-06-27 ENCOUNTER — Emergency Department (HOSPITAL_BASED_OUTPATIENT_CLINIC_OR_DEPARTMENT_OTHER)
Admission: EM | Admit: 2024-06-27 | Discharge: 2024-06-27 | Disposition: A | Discharge status: Home / Self Care | Attending: Emergency Medicine | Admitting: Emergency Medicine

## 2024-06-27 DIAGNOSIS — T161XXA Foreign body in right ear, initial encounter: Secondary | ICD-10-CM | POA: Diagnosis present

## 2024-06-27 DIAGNOSIS — H60501 Unspecified acute noninfective otitis externa, right ear: Secondary | ICD-10-CM | POA: Insufficient documentation

## 2024-06-27 DIAGNOSIS — W448XXA Other foreign body entering into or through a natural orifice, initial encounter: Secondary | ICD-10-CM | POA: Diagnosis not present

## 2024-06-27 MED ORDER — CIPROFLOXACIN HCL 0.2 % OT SOLN
0.2000 mL | Freq: Two times a day (BID) | OTIC | 0 refills | Status: AC
Start: 2024-06-27 — End: ?

## 2024-06-27 NOTE — ED Provider Notes (Signed)
 [place] EMERGENCY DEPARTMENT AT [facility] Provider Note   CSN: [id number] Arrival date & time: 06/27/24  1426     Patient presents with: Foreign Body in Ear   Chloe Mitchell is a 33 y.o. female presents today for Q-tip in right ear since last night.  Patient denies pain, fever, nausea, vomiting, any other complaints at this time.  Does have mildly reduced hearing in that ear.    Foreign Body in Ear       Prior to Admission medications   Medication Sig Start Date End Date Taking? Authorizing Provider  Ciprofloxacin HCl 0.2 % otic solution Place 0.2 mLs into the right ear 2 (two) times daily. 06/27/24  Yes Tamari Busic N, PA-C  ARIPiprazole  (ABILIFY ) 15 MG tablet Take 1 tablet (15 mg total) by mouth daily. 06/04/22   Mozingo, Regina Nattalie, NP  FLUoxetine  (PROZAC ) 40 MG capsule Take 1 capsule (40 mg total) by mouth daily. 06/04/22   Mozingo, Regina Nattalie, NP  gabapentin  (NEURONTIN ) 300 MG capsule Take 1 capsule (300 mg total) by mouth 3 (three) times daily. 06/04/22   Mozingo, Regina Nattalie, NP  medroxyPROGESTERone  (DEPO-PROVERA ) 150 MG/ML injection INJECT 1 ML INTRAMUSCULAR EVERY 3 MONTHS 90 DAYS 09/24/20 09/24/21  Orischak, Alexandra J, FNP  medroxyPROGESTERone  Acetate 150 MG/ML SUSY Inject 1 mL (150 mg total) into the muscle every 3 (three) months. 09/24/20     meloxicam  (MOBIC ) 15 MG tablet Take 1 tablet (15 mg total) by mouth daily. 04/07/22   Hyatt, Max T, DPM  montelukast (SINGULAIR) 10 MG tablet Take 10 mg by mouth daily. 04/01/22   [provider]  predniSONE  (DELTASONE ) 20 MG tablet Take 2 tablets daily with breakfast. 04/03/22   Christopher Savannah, PA-C    Allergies: Patient has no known allergies.    Review of Systems  HENT:  Positive for hearing loss.     Updated Vital Signs BP (!) 140/78 (BP Location: Right Arm)   Pulse 93   Temp 98.7 F (37.1 C)   Resp 16   SpO2 99%   Physical Exam Vitals and nursing note reviewed.   Constitutional:      General: She is not in acute distress.    Appearance: She is well-developed.  HENT:     Head: Normocephalic and atraumatic.     Right Ear: External ear normal. A foreign body is present.     Left Ear: Tympanic membrane and external ear normal.     Ears:     Comments: Upon foreign body removal from right ear canal, ear canal is swollen and erythematous. Eyes:     Conjunctiva/sclera: Conjunctivae normal.  Cardiovascular:     Rate and Rhythm: Normal rate and regular rhythm.     Heart sounds: No murmur heard. Pulmonary:     Effort: Pulmonary effort is normal. No respiratory distress.     Breath sounds: Normal breath sounds.  Abdominal:     Palpations: Abdomen is soft.     Tenderness: There is no abdominal tenderness.  Musculoskeletal:        General: No swelling.     Cervical back: Neck supple.  Skin:    General: Skin is warm and dry.     Capillary Refill: Capillary refill takes less than 2 seconds.  Neurological:     Mental Status: She is alert.  Psychiatric:        Mood and Affect: Mood normal.     (all labs ordered are listed, but only abnormal results are  displayed) Labs Reviewed - No data to display  EKG: None  Radiology: No results found.   .Foreign Body Removal  Date/Time: 06/27/2024 3:29 PM  Performed by: Francis Ileana SAILOR, PA-C Authorized by: Francis Ileana SAILOR, PA-C  Consent: Verbal consent obtained Risks and benefits: risks, benefits and alternatives were discussed Consent given by: patient Patient identity confirmed: arm band Body area: ear Location details: right ear  Sedation: Patient sedated: no  Patient restrained: no Patient cooperative: yes Localization method: visualized Removal mechanism: alligator forceps Complexity: simple 1 objects recovered. Objects recovered: Tip of Q-tip Post-procedure assessment: foreign body removed Patient tolerance: patient tolerated the procedure well with no immediate complications      Medications Ordered in the ED - No data to display                                  Medical Decision Making  This patient presents to the ED for concern of right ear issue differential diagnosis includes back for cerumen, foreign body, acute otitis externa, acute otitis media   Problem List / ED Course:  Considered for admission or further workup however patient's vital signs and physical exam are reassuring.  Foreign body was successfully removed.  There is evidence of external ear infection, patient placed on ciprofloxacin otic for presumed acute otitis externa.  Patient given return precautions.  I feel patient is safer discharge at this time.      Final diagnoses:  Ear foreign body, right, initial encounter  Acute otitis externa of right ear, unspecified type    ED Discharge Orders          Ordered    Ciprofloxacin HCl 0.2 % otic solution  2 times daily        06/27/24 1527               Francis Ileana SAILOR, PA-C 06/27/24 1531    Bernard Drivers, MD 06/27/24 1859

## 2024-06-27 NOTE — ED Triage Notes (Signed)
 States qtip in R ear since last night, Denies pain.

## 2024-06-27 NOTE — Discharge Instructions (Addendum)
 Today you were seen for a foreign body in your right ear.  You have been prescribed drops for an ear infection.  Please use your medication as directed.  Thank you for letting us  treat you today. After performing a physical exam, I feel you are safe to go home. Please follow up with your PCP in the next several days and provide them with your records from this visit. Return to the Emergency Room if pain becomes severe or symptoms worsen.
